# Patient Record
Sex: Female | Born: 1965 | Race: White | Hispanic: No | Marital: Married | Smoking: Never smoker
Health system: Southern US, Community
[De-identification: ages and names within clinical notes are randomized; demographics above are authoritative.]

## PROBLEM LIST (undated history)

## (undated) DIAGNOSIS — N83201 Unspecified ovarian cyst, right side: Secondary | ICD-10-CM

## (undated) DIAGNOSIS — G43709 Chronic migraine without aura, not intractable, without status migrainosus: Secondary | ICD-10-CM

## (undated) DIAGNOSIS — K5790 Diverticulosis of intestine, part unspecified, without perforation or abscess without bleeding: Secondary | ICD-10-CM

## (undated) DIAGNOSIS — K36 Other appendicitis: Secondary | ICD-10-CM

## (undated) DIAGNOSIS — K8020 Calculus of gallbladder without cholecystitis without obstruction: Secondary | ICD-10-CM

## (undated) DIAGNOSIS — K589 Irritable bowel syndrome without diarrhea: Secondary | ICD-10-CM

## (undated) DIAGNOSIS — I509 Heart failure, unspecified: Secondary | ICD-10-CM

## (undated) DIAGNOSIS — N83202 Unspecified ovarian cyst, left side: Secondary | ICD-10-CM

## (undated) DIAGNOSIS — N200 Calculus of kidney: Secondary | ICD-10-CM

## (undated) DIAGNOSIS — I10 Essential (primary) hypertension: Secondary | ICD-10-CM

## (undated) DIAGNOSIS — L409 Psoriasis, unspecified: Secondary | ICD-10-CM

## (undated) DIAGNOSIS — E119 Type 2 diabetes mellitus without complications: Secondary | ICD-10-CM

## (undated) DIAGNOSIS — D6851 Activated protein C resistance: Secondary | ICD-10-CM

## (undated) DIAGNOSIS — I639 Cerebral infarction, unspecified: Secondary | ICD-10-CM

## (undated) DIAGNOSIS — E782 Mixed hyperlipidemia: Secondary | ICD-10-CM

## (undated) DIAGNOSIS — D649 Anemia, unspecified: Secondary | ICD-10-CM

## (undated) DIAGNOSIS — G4733 Obstructive sleep apnea (adult) (pediatric): Secondary | ICD-10-CM

## (undated) DIAGNOSIS — K635 Polyp of colon: Secondary | ICD-10-CM

## (undated) DIAGNOSIS — I201 Angina pectoris with documented spasm: Secondary | ICD-10-CM

## (undated) DIAGNOSIS — C4490 Unspecified malignant neoplasm of skin, unspecified: Secondary | ICD-10-CM

## (undated) DIAGNOSIS — J45909 Unspecified asthma, uncomplicated: Secondary | ICD-10-CM

## (undated) HISTORY — DX: Essential (primary) hypertension: I10

## (undated) HISTORY — DX: Calculus of kidney: N20.0

## (undated) HISTORY — DX: Type 2 diabetes mellitus without complications: E11.9

## (undated) HISTORY — DX: Unspecified ovarian cyst, right side: N83.201

## (undated) HISTORY — DX: Chronic migraine without aura, not intractable, without status migrainosus: G43.709

## (undated) HISTORY — DX: Obstructive sleep apnea (adult) (pediatric): G47.33

## (undated) HISTORY — DX: Activated protein C resistance: D68.51

## (undated) HISTORY — DX: Psoriasis, unspecified: L40.9

## (undated) HISTORY — DX: Angina pectoris with documented spasm: I20.1

## (undated) HISTORY — DX: Unspecified ovarian cyst, right side: N83.202

## (undated) HISTORY — DX: Diverticulosis of intestine, part unspecified, without perforation or abscess without bleeding: K57.90

## (undated) HISTORY — DX: Irritable bowel syndrome, unspecified: K58.9

## (undated) HISTORY — DX: Unspecified malignant neoplasm of skin, unspecified: C44.90

## (undated) HISTORY — DX: Other appendicitis: K36

## (undated) HISTORY — DX: Unspecified asthma, uncomplicated: J45.909

## (undated) HISTORY — DX: Cerebral infarction, unspecified: I63.9

## (undated) HISTORY — PX: OOPHORECTOMY: SHX6387

## (undated) HISTORY — PX: CHOLECYSTECTOMY: SHX55

## (undated) HISTORY — PX: TONSILLECTOMY: SUR1361

## (undated) HISTORY — DX: Heart failure, unspecified: I50.9

## (undated) HISTORY — DX: Mixed hyperlipidemia: E78.2

## (undated) HISTORY — DX: Anemia, unspecified: D64.9

## (undated) HISTORY — DX: Calculus of gallbladder without cholecystitis without obstruction: K80.20

## (undated) HISTORY — DX: Polyp of colon: K63.5

---

## 1989-01-30 HISTORY — PX: TUBAL LIGATION: SHX77

## 2016-09-27 DIAGNOSIS — D6851 Activated protein C resistance: Secondary | ICD-10-CM | POA: Insufficient documentation

## 2016-09-27 DIAGNOSIS — K36 Other appendicitis: Secondary | ICD-10-CM | POA: Insufficient documentation

## 2016-09-27 DIAGNOSIS — I201 Angina pectoris with documented spasm: Secondary | ICD-10-CM | POA: Insufficient documentation

## 2016-09-27 DIAGNOSIS — E782 Mixed hyperlipidemia: Secondary | ICD-10-CM | POA: Insufficient documentation

## 2016-09-27 DIAGNOSIS — I10 Essential (primary) hypertension: Secondary | ICD-10-CM | POA: Insufficient documentation

## 2017-03-09 DIAGNOSIS — G43709 Chronic migraine without aura, not intractable, without status migrainosus: Secondary | ICD-10-CM | POA: Insufficient documentation

## 2017-12-20 DIAGNOSIS — Z8742 Personal history of other diseases of the female genital tract: Secondary | ICD-10-CM | POA: Insufficient documentation

## 2017-12-20 DIAGNOSIS — N816 Rectocele: Secondary | ICD-10-CM | POA: Insufficient documentation

## 2017-12-20 DIAGNOSIS — R198 Other specified symptoms and signs involving the digestive system and abdomen: Secondary | ICD-10-CM | POA: Insufficient documentation

## 2017-12-20 DIAGNOSIS — J4520 Mild intermittent asthma, uncomplicated: Secondary | ICD-10-CM | POA: Insufficient documentation

## 2017-12-20 DIAGNOSIS — R1031 Right lower quadrant pain: Secondary | ICD-10-CM | POA: Insufficient documentation

## 2017-12-20 DIAGNOSIS — R140 Abdominal distension (gaseous): Secondary | ICD-10-CM | POA: Insufficient documentation

## 2017-12-20 DIAGNOSIS — R87610 Atypical squamous cells of undetermined significance on cytologic smear of cervix (ASC-US): Secondary | ICD-10-CM | POA: Insufficient documentation

## 2017-12-20 DIAGNOSIS — G8929 Other chronic pain: Secondary | ICD-10-CM | POA: Insufficient documentation

## 2018-09-25 DIAGNOSIS — G459 Transient cerebral ischemic attack, unspecified: Secondary | ICD-10-CM | POA: Insufficient documentation

## 2018-09-25 DIAGNOSIS — I341 Nonrheumatic mitral (valve) prolapse: Secondary | ICD-10-CM | POA: Insufficient documentation

## 2018-09-25 DIAGNOSIS — K7581 Nonalcoholic steatohepatitis (NASH): Secondary | ICD-10-CM | POA: Insufficient documentation

## 2018-09-25 DIAGNOSIS — R7303 Prediabetes: Secondary | ICD-10-CM | POA: Insufficient documentation

## 2018-10-18 DIAGNOSIS — E119 Type 2 diabetes mellitus without complications: Secondary | ICD-10-CM | POA: Insufficient documentation

## 2019-07-29 DIAGNOSIS — G4733 Obstructive sleep apnea (adult) (pediatric): Secondary | ICD-10-CM | POA: Insufficient documentation

## 2019-12-31 DIAGNOSIS — K760 Fatty (change of) liver, not elsewhere classified: Secondary | ICD-10-CM

## 2019-12-31 HISTORY — DX: Fatty (change of) liver, not elsewhere classified: K76.0

## 2020-01-01 DIAGNOSIS — K760 Fatty (change of) liver, not elsewhere classified: Secondary | ICD-10-CM | POA: Insufficient documentation

## 2020-01-15 DIAGNOSIS — Z8673 Personal history of transient ischemic attack (TIA), and cerebral infarction without residual deficits: Secondary | ICD-10-CM | POA: Insufficient documentation

## 2020-02-05 DIAGNOSIS — R109 Unspecified abdominal pain: Secondary | ICD-10-CM | POA: Insufficient documentation

## 2020-02-05 DIAGNOSIS — L409 Psoriasis, unspecified: Secondary | ICD-10-CM | POA: Insufficient documentation

## 2020-02-05 DIAGNOSIS — G8929 Other chronic pain: Secondary | ICD-10-CM | POA: Insufficient documentation

## 2020-02-05 DIAGNOSIS — Z91014 Allergy to mammalian meats: Secondary | ICD-10-CM | POA: Insufficient documentation

## 2020-02-11 ENCOUNTER — Other Ambulatory Visit: Payer: Self-pay

## 2020-02-11 NOTE — Progress Notes (Signed)
New patient paperwork

## 2020-02-12 ENCOUNTER — Encounter: Payer: Self-pay | Admitting: Gastroenterology

## 2020-02-12 ENCOUNTER — Ambulatory Visit: Payer: Federal, State, Local not specified - PPO | Admitting: Gastroenterology

## 2020-02-12 VITALS — BP 130/80 | HR 84 | Ht 63.78 in | Wt 166.0 lb

## 2020-02-12 DIAGNOSIS — R1031 Right lower quadrant pain: Secondary | ICD-10-CM

## 2020-02-12 DIAGNOSIS — K5900 Constipation, unspecified: Secondary | ICD-10-CM

## 2020-02-12 DIAGNOSIS — R140 Abdominal distension (gaseous): Secondary | ICD-10-CM | POA: Diagnosis not present

## 2020-02-12 MED ORDER — CITRUCEL PO POWD: ORAL | Status: DC

## 2020-02-12 MED ORDER — DICYCLOMINE HCL 10 MG PO CAPS: 10.0000 mg | ORAL_CAPSULE | Freq: Three times a day (TID) | ORAL | 1 refills | Status: DC | PRN

## 2020-02-12 MED ORDER — IBGARD 90 MG PO CPCR: ORAL_CAPSULE | ORAL | Status: DC

## 2020-02-12 NOTE — Progress Notes (Signed)
HPI :  55 year old female with a history of chronic abdominal pain, fatty liver, factor V Leiden, self referred here for another opinion on chronic abdominal pain.  The patient is accompanied by her husband today. She reports having a focal spot on her right lower abdomen/inguinal area that is been painful since 2008 or so. This initially started as a more of a diffuse lower abdominal discomfort, at that time she had an evaluation which showed her left ovary was abnormal and she underwent left oophorectomy. This unfortunately did not relieve her symptoms and she has had more persistent pain in the right side over that time. She states the pain is present constantly, she is never pain-free. On average the pain is rated 2-3 out of 10 although she does have fluctuations in the severity and she has had severe pain from this which has brought her to the emergency room in the past. She had a prior colonoscopy years ago she thinks in 2015 which showed some scarring or inflammatory changes at the appendix. Raise the question of chronic appendicitis. She has had CT scans in the past where visualization of the appendix has been difficult. She most recently had a CT scan on December 1 which per the report shows a normal appendix. She has seen surgeons in the past to have reviewed her scans with her and have discussed appendectomy but they have not guaranteed it would take away her symptoms and she has not had this done. She actually had a colonoscopy within the past few weeks by general surgeon in [place] who wrote a limited report but appendiceal orifice was identified and no concerning findings were reported. No polyps.  She states that if she feels constipated this generally makes her feel worse. Following a bowel prep she states she has had some relief of the discomfort. Bowel movement somewhat improved the discomfort which she describes as a pressure in that area, but does not stop it. She has roughly 1 bowel  movement per day, takes fiber pills and does have some bloating at baseline. She has taken MiraLAX in the past which did not work as well for her. She feels as though if she leans forward that can also precipitate symptoms and there is some positional component to this as well. She denies any trauma to the area originally. She denies any weight loss. No blood in her stools but she does have hemorrhoids that bleed occasionally. She states she does have difficulty completely evacuating her rectum. She was seen at [facility] in 2019 and underwent anorectal manometry which was reportedly normal.  She otherwise eats okay, has no nausea or vomiting, no reflux symptoms, no upper abdominal complaints. No family history of colon cancer. Her niece did have ulcerative colitis. She states she tries not to take much of anything for this pain and ended up just dealing with it. When pain gets bad she takes Naprosyn as needed. She has been given peppermint supplements in the past to treat for bowel spasm and states this does help her from time to time. She is very frustrated by persistent symptoms over time and is looking for relief. Husband reports during tubal ligation there was some silicone implant as part of that and she states she is allergic to silicone and they wonder if that is what is causing her pain.  Prior workup Colonoscopy - 01/27/20 - Dr. Sherryl Manges - left sided diverticulosis, hemorrhoids - good prep reported  [place] 01/05/20 - normal uterus, R ovary  with 1.2cm cyst, otherwise normal  CT abdomen / pelvis 12/31/19 - fatty liver, normal appendix, normal pancreas, post cholecystectomy, normal CBD,   Xray abdomen 12/29/19 - normal  CT abdomen / pelvis with contrast 12/15/2016 - suspected 3.1 cm right ovary, appendix not visualized, bowel is normal, fatty liver, post chole  [place] vaginal 12/20/2017: unremarkable with normal appearing uterus (endometrium 3 mm thick), absent left ovary, and right ovary with only  a simple follicle (normal finding as she is still menstruating regularly). No other pelvic mass is noted.  Anorectal manometry 2019 - [facility] - normal  Fecal calprotectin negative  Colonoscopy 10/22/2013: Cecal polyp, polypectomy (TA) bengin; Ascending colon polyp, polypectomy (TA) benign; Cecum, polypectomy, prominent lymphoid aggregates. Moderate internal hemorrhoids noted.   Labs 12/31/19: - AST 24, ALT 27, AP 50, T bil 0.37 - BUN 20, Cr 0.93 - WBC 9.6, Hgb 14.6, MCV 94.6, plt 340  Labs 02/05/20: - AP 63, AST 38, ALT 39, normal renal function - WBC 7.31, Hgb 15.0, MCV 100, plt 381   Past Medical History:  Diagnosis Date  . Anemia   . Asthma   . Bilateral ovarian cysts   . CHF (congestive heart failure) ([place])   . Chronic appendicitis   . Chronic migraine without aura   . Colon polyps   . Essential hypertension   . Factor V Leiden mutation ([place])   . Gallstones   . Hepatic steatosis 12/2019   CT  . IBS (irritable bowel syndrome)   . Kidney stones   . Mixed hyperlipidemia   . Obstructive sleep apnea of adult    CPAP  . 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 Psoriasis   . Skin cancer   . Stroke ([place])   . Type II diabetes mellitus ([place])      Past Surgical History:  Procedure Laterality Date  . CHOLECYSTECTOMY    . OOPHORECTOMY Left   . TONSILLECTOMY    . TUBAL LIGATION  1991   Family History  Problem Relation Age of Onset  . Hypertension Mother   . Alzheimer's disease Mother   . Colon polyps Mother   . Factor V Leiden deficiency Mother   . Deep vein thrombosis Mother   . Heart disease Mother   . Hypertension Father   . Heart disease Father   . Multiple sclerosis Sister   . Alzheimer's disease Maternal Grandmother   . Colon polyps Brother   . Diabetes Brother   . Liver disease Maternal Grandfather   . Liver disease Paternal Grandfather   . Liver disease Paternal Uncle   . Colon polyps Sister   . Factor V Leiden deficiency Sister   . Celiac disease Niece   .  Ulcerative colitis Niece   . Irritable bowel syndrome Niece    Social History   Tobacco Use  . Smoking status: Never Smoker  . Smokeless tobacco: Never Used  Vaping Use  . Vaping Use: Never used  Substance Use Topics  . Alcohol use: Yes    Comment: rarely-once a year  . Drug use: Never   Current Outpatient Medications  Medication Sig Dispense Refill  . acetaminophen (TYLENOL) 500 MG tablet Take 500 mg by mouth as needed.    Marland Kitchen aspirin EC 81 MG tablet Take 81 mg by mouth daily. Swallow whole.    . Cholecalciferol (VITAMIN D3) 50 MCG (2000 UT) CAPS Take by mouth daily.    . Coenzyme Q10 (COQ-10) 100 MG CAPS Take 1 capsule by mouth daily.    Marland Kitchen  hydrochlorothiazide (HYDRODIURIL) 25 MG tablet Take 25 mg by mouth daily.    . Ibuprofen 200 MG CAPS Take by mouth as needed.    . Magnesium Gluconate (MAGNESIUM 27 PO) Take by mouth.    . Multiple Vitamin (MULTIVITAMIN) tablet Take 1 tablet by mouth daily.    . Omega-3 Fatty Acids (FISH OIL) 1000 MG CAPS Take 2 capsules by mouth 2 (two) times daily.    . TURMERIC CURCUMIN PO Take 1 tablet by mouth daily.    Marland Kitchen EPINEPHrine 0.3 mg/0.3 mL IJ SOAJ injection as needed. (Patient not taking: Reported on 02/12/2020)     No current facility-administered medications for this visit.   Allergies  Allergen Reactions  . Penicillins Anaphylaxis and Other (See Comments)  . Beef Allergy   . Codeine Other (See Comments)    Spasms   . Gluten Meal Other (See Comments)  . Lactose Intolerance (Gi)   . Meperidine Hcl   . Sulfa Antibiotics Other (See Comments)    Cramping and bloating      Review of Systems: All systems reviewed and negative except where noted in HPI.   Labs and imaging per HPI   Physical Exam: BP 130/80 (BP Location: Left Arm, Patient Position: Sitting, Cuff Size: Normal)   Pulse 84   Ht 5' 3.78" (1.62 m) Comment: height measured without shoes  Wt 166 lb (75.3 kg)   LMP 02/01/2020   BMI 28.69 kg/m  Constitutional:  Pleasant,well-developed,female in no acute distress. HEENT: Normocephalic and atraumatic. Conjunctivae are normal. No scleral icterus. Neck supple.  Cardiovascular: Normal rate, regular rhythm.  Pulmonary/chest: Effort normal and breath sounds normal. No wheezing, rales or rhonchi. Abdominal: Soft, nondistended, nontender. Focal area of tenderness just superior to mid inguinal line in RLQ, negative carnett. There are no masses palpable.  Extremities: no edema Lymphadenopathy: No cervical adenopathy noted. Neurological: Alert and oriented to person place and time. Skin: Skin is warm and dry. No rashes noted. Psychiatric: Normal mood and affect. Behavior is normal.   ASSESSMENT AND PLAN: 55 y/o female here for new patient consultation regarding the following:  Abdominal pain Bloating Constipation  See detailed history as outlined above. She has had an extensive evaluation with multiple imaging studies and colonoscopy without clear etiology for her pain. There was a question of some inflammatory changes at the AO on prior colonoscopy although that was not seen on recent colonoscopy and recent CT scan does not show any clear abnormalities there. She has seen a general surgery to consider appendectomy in the past but I would think this is a last resort as I'm not convinced this would relieve her symptoms. I discussed causes of chronic abdominal pain with her. She does have some slight improvement with a bowel movement but does not relieve it significantly. There is also some positional component to this as well. It is very possible this could be musculoskeletal or nerve impingement in etiology and we discussed what that is. Bowel spasm could also be playing a role in this although perhaps less likely. I reassured them that we do not see anything concerning on her imaging or colonoscopy, she does not have cancer or Crohn's disease etc. She does have some benefit at times with peppermint, she can  continue that as needed in the form of IBgard, I will also give her a trial of antispasmodic Bentyl 10 mg, 1-2 tabs every 8 hours as needed to see if that provides any benefit. In light of the bloating she has we  will stop her gummy fibers and switch to Citrucel to see if that is better tolerated. We will see how she does with this over the next few weeks. If symptoms persist and continue to bother her I will refer her to sports medicine clinic, Dr. Gardenia Phlegm for evaluation of possible nerve impingement or musculoskeletal etiology. If that is a negative evaluation or they cannot get her feeling better than ex lap with appendectomy I would think it is her last resort/option, as it would be uncertain if that would provide any relief to her pain. She agreed with the plan, all questions answered, they will touch base with me in a few weeks and give me an update.  I spent 65 minutes of time, including in depth chart review, independent review of results as outlined above,  face-to-face time with the patient, and documenting this encounter   Richfield Cellar, MD [facility]

## 2020-02-12 NOTE — Patient Instructions (Addendum)
If you are age 56 or older, your body mass index should be between 23-30. Your Body mass index is 28.69 kg/m. If this is out of the aforementioned range listed, please consider follow up with your Primary Care Provider.  If you are age 30 or younger, your body mass index should be between 19-25. Your Body mass index is 28.69 kg/m. If this is out of the aformentioned range listed, please consider follow up with your Primary Care Provider.   Discontinue fiber gummies.  Please purchase the following medications over the counter and take as directed: Citrucel: Take once daily  We have sent the following medications to your pharmacy for you to pick up at your convenience: Bentyl 10 mg: Take 1 to 2 tablets every 8 hours as needed  Continue IBgard.  Please contact us in 2 weeks if you are not improved.    Thank you for entrusting me with your care and for choosing [facility], Dr. [place] Cellar

## 2020-02-20 DIAGNOSIS — K5900 Constipation, unspecified: Secondary | ICD-10-CM | POA: Insufficient documentation

## 2020-02-20 DIAGNOSIS — N852 Hypertrophy of uterus: Secondary | ICD-10-CM | POA: Insufficient documentation

## 2020-02-20 DIAGNOSIS — Z9851 Tubal ligation status: Secondary | ICD-10-CM | POA: Insufficient documentation

## 2020-02-26 ENCOUNTER — Telehealth: Payer: Self-pay | Admitting: Gastroenterology

## 2020-02-26 DIAGNOSIS — R1031 Right lower quadrant pain: Secondary | ICD-10-CM

## 2020-02-26 NOTE — Telephone Encounter (Signed)
Ambulatory referral to Dr. Hulan Saas at Sports medicine in [place].  Spoke with patient, she is aware that referral has been placed. Advised patient that it may take about a week for them to get her scheduled, advised that she could try calling to expedite the process. I provided patient with the phone number to Dr. Genevie Ann office. Patient verbalized understanding of all information and had no concerns at the end of the call.

## 2020-02-26 NOTE — Telephone Encounter (Signed)
Okay, thanks for the follow up. Can you please refer her to Dr. Gardenia Phlegm of sports medicine for evaluation? Thanks

## 2020-02-26 NOTE — Telephone Encounter (Addendum)
Spoke with patient, she wanted to let us know that she has not seen any improvement in her abdominal pain with Bentyl that was prescribed, she states that she does take the [place] as needed, she states that her symptoms are "unchanged", reports RLQ pain that she can't describe but she knows something is there. Patient would like to proceed with sports medicine evaluation at this time. Please advise, thank you.

## 2020-03-02 ENCOUNTER — Other Ambulatory Visit: Payer: Self-pay

## 2020-03-02 ENCOUNTER — Encounter: Payer: Self-pay | Admitting: Family Medicine

## 2020-03-02 ENCOUNTER — Ambulatory Visit: Payer: Self-pay

## 2020-03-02 ENCOUNTER — Telehealth: Payer: Self-pay | Admitting: Gastroenterology

## 2020-03-02 ENCOUNTER — Ambulatory Visit: Payer: Federal, State, Local not specified - PPO | Admitting: Family Medicine

## 2020-03-02 VITALS — BP 142/82 | HR 87 | Ht 63.0 in | Wt 166.0 lb

## 2020-03-02 DIAGNOSIS — R1031 Right lower quadrant pain: Secondary | ICD-10-CM

## 2020-03-02 DIAGNOSIS — G8929 Other chronic pain: Secondary | ICD-10-CM | POA: Diagnosis not present

## 2020-03-02 NOTE — Patient Instructions (Signed)
Nice to meet you I do not see a true hernia Do have some constipation Some pelvic floor dysfunction that could be contributing Speak with Gyn for next steps My next step would be PT for pelvic floor Write to me in MyChart with any questions

## 2020-03-02 NOTE — Telephone Encounter (Signed)
Pt is requesting a call back from a nurse (pt did not disclose further info)

## 2020-03-02 NOTE — Telephone Encounter (Signed)
Lm on vm for patient to return call 

## 2020-03-02 NOTE — Telephone Encounter (Signed)
Spoke with patient, she states that she had her appt with sports medicine today, she states that she does not have a hernia,she was told that she had narrowing of the bowel and then a big dilation beyond it, she stated that the [place] also showed stool calcifications, Dr. Tamala Julian recommended PT for pelvic floor dysfunction but they will discuss that further after patient follows up with GYN. Patient states that she currently takes 1 & 1/2 doses of Miralax daily along with 2 gm of Citrucel in the am and evening, pt states that she is having bowel movements - last one was earlier today, she reports that the LLQ pain has subsided a little bit. Patient wants to completely clean her colon and get rid of the calcifications, advised that she can try a Miralax purge, she states that she was previously on Linzess and wanted to know if you would consider a trial of this for her, she does not remember the dosage that she was on previously but states that she is willing to try it again. Please advise, thank you.

## 2020-03-02 NOTE — Telephone Encounter (Signed)
Thanks [employer]. I just read Dr. Thompson Caul note - he does not see an obvious hernia, could be multifactorial pain. She recently had a colonoscopy a month ago, so not sure what to make of stool burden there on [place]. I don't think constipation is causing all of her pain but if she wants to try a more aggressive bowel regimen we can give her Linzess 151mg / day for 1 month to try and see how she does with that, she can take 1-2 tabs per day. If she wants to do a Miralax purge as well that is fine. Thanks

## 2020-03-02 NOTE — Progress Notes (Signed)
[facility] [place] Phone: [phone] Subjective:   Fontaine No, am serving as a scribe for Dr. Hulan Saas. This visit occurred during the SARS-CoV-2 public health emergency.  Safety protocols were in place, including screening questions prior to the visit, additional usage of staff PPE, and extensive cleaning of exam room while observing appropriate contact time as indicated for disinfecting solutions.   I'm seeing this patient by the request  of:  Emelda Fear, DO  CC: Right lower quadrant pain  JOA:[id number]  Pasqualina Colasurdo is a 55 y.o. female coming in with complaint of RLQ pain for years.  Thinks it is starting since 2008.  Pain is constant and dull. Has had episodes that become very sharp where she will have to go into ED. No pattern to her pain. Does not pain when she was putting dishes overhead and noticed pain in lower quadrant. Also was on knees fixing dishwasher and she tensed up as her dog came near her and she felt sudden sharp pain.   History of positive ANA.  Had tubes tied and feels that ring may be causing allergy.   Patient has had multiple different work-ups for this over the course of the years.  Patient has had diagnoses such as chronic appendicitis, rectocele, ovarian cyst, as well as enlarged uterus patient is concerned that some of this could be coming from an allergy to the silicone from her tubal ligation..    Prior workup Colonoscopy - 01/27/20 - Dr. Sherryl Manges - left sided diverticulosis, hemorrhoids - good prep reported  [place] 01/05/20 - normal uterus, R ovary with 1.2cm cyst, otherwise normal  CT abdomen / pelvis 12/31/19 - fatty liver, normal appendix, normal pancreas, post cholecystectomy, normal CBD,   Xray abdomen 12/29/19 - normal  Past Medical History:  Diagnosis Date  . Anemia   . Asthma   . Bilateral ovarian cysts   . CHF (congestive heart failure) ([place])   . Chronic  appendicitis   . Chronic migraine without aura   . Colon polyps   . Diverticulosis   . Essential hypertension   . Factor V Leiden mutation ([place])   . Gallstones   . Hepatic steatosis 12/2019   CT  . IBS (irritable bowel syndrome)   . Kidney stones   . Mixed hyperlipidemia   . Obstructive sleep apnea of adult    CPAP  . 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 Psoriasis   . Skin cancer   . Stroke ([place])   . Type II diabetes mellitus ([place])    Past Surgical History:  Procedure Laterality Date  . CHOLECYSTECTOMY    . OOPHORECTOMY Left   . TONSILLECTOMY    . TUBAL LIGATION  1991   Social History   Socioeconomic History  . Marital status: Married    Spouse name: Not on file  . Number of children: 2  . Years of education: Not on file  . Highest education level: Not on file  Occupational History  . Occupation: Home maker  Tobacco Use  . Smoking status: Never Smoker  . Smokeless tobacco: Never Used  Vaping Use  . Vaping Use: Never used  Substance and Sexual Activity  . Alcohol use: Yes    Comment: rarely-once a year  . Drug use: Never  . Sexual activity: Not on file  Other Topics Concern  . Not on file  Social History Narrative  . Not on file   Social Determinants  of Health   Financial Resource Strain: Not on file  Food Insecurity: Not on file  Transportation Needs: Not on file  Physical Activity: Not on file  Stress: Not on file  Social Connections: Not on file   Allergies  Allergen Reactions  . Penicillins Anaphylaxis and Other (See Comments)  . Beef Allergy   . Codeine Other (See Comments)    Spasms   . Gluten Meal Other (See Comments)  . Lactose Intolerance (Gi)   . Meperidine Hcl   . Sulfa Antibiotics Other (See Comments)    Cramping and bloating    Family History  Problem Relation Age of Onset  . Hypertension Mother   . Alzheimer's disease Mother   . Colon polyps Mother   . Factor V Leiden deficiency Mother   . Deep vein thrombosis Mother   . Heart  disease Mother   . Hypertension Father   . Heart disease Father   . Multiple sclerosis Sister   . Alzheimer's disease Maternal Grandmother   . Colon polyps Brother   . Diabetes Brother   . Liver disease Maternal Grandfather   . Liver disease Paternal Grandfather   . Liver disease Paternal Uncle   . Colon polyps Sister   . Factor V Leiden deficiency Sister   . Celiac disease Niece   . Ulcerative colitis Niece   . Irritable bowel syndrome Niece      Current Outpatient Medications (Cardiovascular):  Marland Kitchen  EPINEPHrine 0.3 mg/0.3 mL IJ SOAJ injection, as needed. .  hydrochlorothiazide (HYDRODIURIL) 25 MG tablet, Take 25 mg by mouth daily.   Current Outpatient Medications (Analgesics):  .  acetaminophen (TYLENOL) 500 MG tablet, Take 500 mg by mouth as needed. Marland Kitchen  aspirin EC 81 MG tablet, Take 81 mg by mouth daily. Swallow whole. .  Ibuprofen 200 MG CAPS, Take by mouth as needed.   Current Outpatient Medications (Other):  Marland Kitchen  Cholecalciferol (VITAMIN D3) 50 MCG (2000 UT) CAPS, Take by mouth daily. .  Coenzyme Q10 (COQ-10) 100 MG CAPS, Take 1 capsule by mouth daily. Marland Kitchen  dicyclomine (BENTYL) 10 MG capsule, Take 1-2 capsules (10-20 mg total) by mouth every 8 (eight) hours as needed for spasms. .  Magnesium Gluconate (MAGNESIUM 27 PO), Take by mouth. .  methylcellulose (CITRUCEL) oral powder, Take as directed, Daily .  Multiple Vitamin (MULTIVITAMIN) tablet, Take 1 tablet by mouth daily. .  Omega-3 Fatty Acids (FISH OIL) 1000 MG CAPS, Take 2 capsules by mouth 2 (two) times daily. Marland Kitchen  Peppermint Oil (IBGARD) 90 MG CPCR, Use as directed .  TURMERIC CURCUMIN PO, Take 1 tablet by mouth daily.   Reviewed prior external information including notes and imaging from  primary care provider As well as notes that were available from care everywhere and other healthcare systems.  Past medical history, social, surgical and family history all reviewed in electronic medical record.  No pertanent  information unless stated regarding to the chief complaint.   Review of Systems:  No headache, visual changes, nausea, vomiting, diarrhea, constipation, dizziness, abdominal pain, skin rash, fevers, chills, night sweats, weight loss, swollen lymph nodes, body aches, joint swelling, chest pain, shortness of breath, mood changes. POSITIVE muscle aches  Objective  Blood pressure (!) 142/82, pulse 87, height 5' 3"  (1.6 m), weight 166 lb (75.3 kg), last menstrual period 02/01/2020, SpO2 98 %.   General: No apparent distress alert and oriented x3 mood and affect normal, dressed appropriately.  HEENT: Pupils equal, extraocular movements intact  Respiratory: Patient's  speak in full sentences and does not appear short of breath  Cardiovascular: No lower extremity edema, non tender, no erythema  Gait normal with good balance and coordination.  MSK:  Non tender with full range of motion and good stability and symmetric strength and tone of shoulders, elbows, wrist, hip, knee and ankles bilaterally.  Abdominal exam shows the patient does have fullness of the right lower quadrant.  Patient does not have any true masses.  Patient is tender to palpation approximately 3 fingerbreadths above the ASIS and 1 fingerbreadth medial to this.  Patient has no pain though with hip flexion, internal and external range of motion.  Inguinal canal does not have any bulging noted.  No pain with adduction of the hip.  Neurovascularly intact distally.  Negative straight leg test.  Limited musculoskeletal ultrasound was performed and interpreted by Lyndal Pulley  Limited ultrasound of patient's abdominal wall does not show any true defect of the fascia noted.  With Valsalva I do not see anything the patient does have a fairly large stool burden noted of the right lower quadrant.  No masses appreciated.  In the area where patient is the most tender but no true hernia noted. Impression: Unremarkable ultrasound of the abdominal  wall   Impression and Recommendations:     The above documentation has been reviewed and is accurate and complete Lyndal Pulley, DO

## 2020-03-02 NOTE — Assessment & Plan Note (Signed)
Patient has had this for many years.  Seen multiple providers including gynecology and gastroenterology.  Patient was sent here to see if there was any musculoskeletal current complaint that could be contributing.  On ultrasound today he then limited in the right lower quadrant I do not see any true hernia noted.  I do believe that patient is having some pelvic floor dysfunction that could be contributing to some of this but I do believe that patient's rectocele, possible ovarian cyst, and chronic constipation could also be contributing to some of her aches and pains that patient is having.  Patient is going to follow-up with her gynecologist to further decide what is the next treatment options including it sounds like they are going to do a transvaginal ultrasound.  Possible other imaging we discussed was the MRI pelvis but I do not think it would show [place] anything else and we did encourage the possibility of formal physical therapy for pelvic floor dysfunction that could be helpful.  Other than that we discussed continuing with her bowel regimen secondary to still a large stool burden in the right lower quadrant.  Patient will hold on follow-up with me until she sees the other providers and discuss afterwards.

## 2020-03-03 MED ORDER — LINACLOTIDE 145 MCG PO CAPS: 145.0000 ug | ORAL_CAPSULE | Freq: Every day | ORAL | 0 refills | Status: DC

## 2020-03-03 NOTE — Telephone Encounter (Signed)
Spoke with patient in regards to Dr. Doyne Keel recommendations. Patient would like to try Linzess 145 mcg, advised that we will try this for a month and see how she does, she is aware that she can try the Miralax purge as well. Advised patient that she may develop loose stools but should let us know. Patient verbalized understanding of all information and had no concerns at the end of the call.   Prescription sent to pharmacy on file - pt confirmed

## 2020-03-04 ENCOUNTER — Telehealth: Payer: Self-pay

## 2020-03-04 NOTE — Telephone Encounter (Signed)
PA for Linzess 145 mcg APPROVED. The authorization is valid from 02/03/2020 through 03/04/2021

## 2020-03-10 NOTE — Telephone Encounter (Signed)
Called and spoke to patient.  The Linzess 145 mcg have been working well.  She wanted to make sure it is OK to take the Citrucel Dr. Havery Moros had recommended. She also will need a refill in a few weeks.  She understands to just let the pharmacy know when she needs a refill.  She wanted Dr. Havery Moros to know how grateful she is. She indicates she has not felt this good in years.

## 2020-03-10 NOTE — Telephone Encounter (Signed)
Thanks Jan. I'm happy to hear this. Sounds like the Linzess has been helping and would continue that. Up to her if she wishes to continue Citrucel on top of that. If this combination works for her that's fine and safe, but she may get the same benefit with just the Linzess at this point.

## 2020-03-10 NOTE — Telephone Encounter (Signed)
Pt is requesting a call back to discuss taking her Linzess with fiber.

## 2020-03-18 NOTE — Progress Notes (Unsigned)
I, Wendy Poet, LAT, ATC, am serving as scribe for Dr. Lynne Leader.  Theresa Gonzales is a 55 y.o. female who presents to [place] [facility] today for L shoulder and elbow pain since last week after sitting in his husband's truck when she was leaning on her L elbow/arm . Pt was last seen in clinic by Dr. Tamala Julian for abdominal pain and RLQ pain on 03/02/20. Pt locates pain to her L lateral shoulder and into her L upper arm.  She is retired but currently renovating an old house.  Radiates: yes into the L upper arm Neck pain: yes into the L lateral neck Aggravates: sitting w/ L elbow flexed for an extended period of time; L shoulder AROM above 90 deg Treatments tried: ice, heat, naproxen   Pertinent review of systems: No fevers or chills  Relevant historical information: History of angina, diabetes, factor V Leiden   Exam:  BP 122/84 (BP Location: Right Arm, Patient Position: Sitting, Cuff Size: Normal)   Pulse 88   Ht 5' 3"  (1.6 m)   Wt 158 lb 9.6 oz (71.9 kg)   SpO2 98%   BMI 28.09 kg/m  General: Well Developed, well nourished, and in no acute distress.   MSK: Left shoulder normal. Tender palpation anterior shoulder. Range of motion abduction 100 degrees.  External rotation full internal rotation lumbar spine. Strength 4/5 abduction 5/5 external and internal rotation. Positive Hawkins and Neer's test. Positive empty can test. Positive Yergason's and speeds test.  Left elbow normal-appearing Nontender. Normal passive range of motion. Normal active range of motion. Pain-free passive range of motion.  However active resisted flexion and active resisted supination produce anterior shoulder pain.    Lab and Radiology Results Procedure: Real-time Ultrasound Guided Injection of left shoulder subacromial bursa Device: Philips Affiniti 50G Images permanently stored and available for review in PACS Ultrasound evaluation prior to injection reveals intact biceps  tendon. Subscapularis tendon is intact. Supraspinatus tendon is thin but intact. Moderate subacromial bursitis is present. Infraspinatus tendon is intact. AC joint degenerative with effusion. Verbal informed consent obtained.  Discussed risks and benefits of procedure. Warned about infection bleeding damage to structures skin hypopigmentation and fat atrophy among others. Patient expresses understanding and agreement Time-out conducted.   Noted no overlying erythema, induration, or other signs of local infection.   Skin prepped in a sterile fashion.   Local anesthesia: Topical Ethyl chloride.   With sterile technique and under real time ultrasound guidance:  40 mg of Kenalog and 2 mL of Marcaine injected into subacromial bursa. Fluid seen entering the bursa.   Completed without difficulty   Pain moderately resolved suggesting accurate placement of the medication.   Advised to call if fevers/chills, erythema, induration, drainage, or persistent bleeding.   Images permanently stored and available for review in the ultrasound unit.  Impression: Technically successful ultrasound guided injection.         Assessment and Plan: 55 y.o. female with left shoulder pain thought to be due to subacromial bursitis.  Patient may have a component of biceps tendinitis although this was not well seen on ultrasound.  Plan for steroid injection as above, and referral to physical therapy.  Recheck back in about 6 weeks.  Recheck or contact me sooner if not improving.   PDMP not reviewed this encounter. Orders Placed This Encounter  Procedures  . [place] LIMITED JOINT SPACE STRUCTURES UP LEFT(NO LINKED CHARGES)    Order Specific Question:   Reason for Exam (  SYMPTOM  OR DIAGNOSIS REQUIRED)    Answer:   L shoulder pain    Order Specific Question:   Preferred imaging location?    Answer:   [place]  . Ambulatory referral to Physical Therapy    Referral Priority:   Routine     Referral Type:   Physical Medicine    Referral Reason:   Specialty Services Required    Requested Specialty:   Physical Therapy   No orders of the defined types were placed in this encounter.    Discussed warning signs or symptoms. Please see discharge instructions. Patient expresses understanding.   The above documentation has been reviewed and is accurate and complete Lynne Leader, M.D.

## 2020-03-19 ENCOUNTER — Ambulatory Visit: Payer: Self-pay

## 2020-03-19 ENCOUNTER — Other Ambulatory Visit: Payer: Self-pay

## 2020-03-19 ENCOUNTER — Encounter: Payer: Self-pay | Admitting: Family Medicine

## 2020-03-19 ENCOUNTER — Ambulatory Visit (INDEPENDENT_AMBULATORY_CARE_PROVIDER_SITE_OTHER): Payer: Federal, State, Local not specified - PPO | Admitting: Family Medicine

## 2020-03-19 VITALS — BP 122/84 | HR 88 | Ht 63.0 in | Wt 158.6 lb

## 2020-03-19 DIAGNOSIS — M25512 Pain in left shoulder: Secondary | ICD-10-CM

## 2020-03-19 NOTE — Patient Instructions (Addendum)
Thank you for coming in today.  Call or go to the ER if you develop a large red swollen joint with extreme pain or oozing puss.   I've referred you to Physical Therapy.  Let us know if you don't hear from them in one week.  Recheck back in about 6 weeks.   Let me know if this is not working or if you have a problem.    Shoulder Impingement Syndrome Rehab Ask your health care provider which exercises are safe for you. Do exercises exactly as told by your health care provider and adjust them as directed. It is normal to feel mild stretching, pulling, tightness, or discomfort as you do these exercises. Stop right away if you feel sudden pain or your pain gets worse. Do not begin these exercises until told by your health care provider. Stretching and range-of-motion exercise This exercise warms up your muscles and joints and improves the movement and flexibility of your shoulder. This exercise also helps to relieve pain and stiffness. Passive horizontal adduction In passive adduction, you use your other hand to move the injured arm toward your body. The injured arm does not move on its own. In this movement, your arm is moved across your body in the horizontal plane (horizontal adduction). 1. Sit or stand and pull your left / right elbow across your chest, toward your other shoulder. Stop when you feel a gentle stretch in the back of your shoulder and upper arm. ? Keep your arm at shoulder height. ? Keep your arm as close to your body as you comfortably can. 2. Hold for __________ seconds. 3. Slowly return to the starting position. Repeat __________ times. Complete this exercise __________ times a day.   Strengthening exercises These exercises build strength and endurance in your shoulder. Endurance is the ability to use your muscles for a long time, even after they get tired. External rotation, isometric This is an exercise in which you press the back of your wrist against a door frame without  moving your shoulder joint (isometric). 1. Stand or sit in a doorway, facing the door frame. 2. Bend your left / right elbow and place the back of your wrist against the door frame. Only the back of your wrist should be touching the frame. Keep your upper arm at your side. 3. Gently press your wrist against the door frame, as if you are trying to push your arm away from your abdomen (external rotation). Press as hard as you are able without pain. ? Avoid shrugging your shoulder while you press your wrist against the door frame. Keep your shoulder blade tucked down toward the middle of your back. 4. Hold for __________ seconds. 5. Slowly release the tension, and relax your muscles completely before you repeat the exercise. Repeat __________ times. Complete this exercise __________ times a day. Internal rotation, isometric This is an exercise in which you press your palm against a door frame without moving your shoulder joint (isometric). 1. Stand or sit in a doorway, facing the door frame. 2. Bend your left / right elbow and place the palm of your hand against the door frame. Only your palm should be touching the frame. Keep your upper arm at your side. 3. Gently press your hand against the door frame, as if you are trying to push your arm toward your abdomen (internal rotation). Press as hard as you are able without pain. ? Avoid shrugging your shoulder while you press your hand against the door  frame. Keep your shoulder blade tucked down toward the middle of your back. 4. Hold for __________ seconds. 5. Slowly release the tension, and relax your muscles completely before you repeat the exercise. Repeat __________ times. Complete this exercise __________ times a day.   Scapular protraction, supine 1. Lie on your back on a firm surface (supine position). Hold a __________ weight in your left / right hand. 2. Raise your left / right arm straight into the air so your hand is directly above your  shoulder joint. 3. Push the weight into the air so your shoulder (scapula) lifts off the surface that you are lying on. The scapula will push up or forward (protraction). Do not move your head, neck, or back. 4. Hold for __________ seconds. 5. Slowly return to the starting position. Let your muscles relax completely before you repeat this exercise. Repeat __________ times. Complete this exercise __________ times a day.   Scapular retraction 1. Sit in a stable chair without armrests, or stand up. 2. Secure an exercise band to a stable object in front of you so the band is at shoulder height. 3. Hold one end of the exercise band in each hand. Your palms should face down. 4. Squeeze your shoulder blades together (retraction) and move your elbows slightly behind you. Do not shrug your shoulders upward while you do this. 5. Hold for __________ seconds. 6. Slowly return to the starting position. Repeat __________ times. Complete this exercise __________ times a day.   Shoulder extension 1. Sit in a stable chair without armrests, or stand up. 2. Secure an exercise band to a stable object in front of you so the band is above shoulder height. 3. Hold one end of the exercise band in each hand. 4. Straighten your elbows and lift your hands up to shoulder height. 5. Squeeze your shoulder blades together and pull your hands down to the sides of your thighs (extension). Stop when your hands are straight down by your sides. Do not let your hands go behind your body. 6. Hold for __________ seconds. 7. Slowly return to the starting position. Repeat __________ times. Complete this exercise __________ times a day.   This information is not intended to replace advice given to you by your health care provider. Make sure you discuss any questions you have with your health care provider. Document Revised: 05/10/2018 Document Reviewed: 02/11/2018 Elsevier Patient Education  2021 [employer].

## 2020-03-24 ENCOUNTER — Ambulatory Visit: Payer: Federal, State, Local not specified - PPO | Admitting: Physical Therapy

## 2020-03-30 ENCOUNTER — Telehealth: Payer: Self-pay | Admitting: Gastroenterology

## 2020-03-30 MED ORDER — LINACLOTIDE 145 MCG PO CAPS: 145.0000 ug | ORAL_CAPSULE | Freq: Every day | ORAL | 1 refills | Status: DC

## 2020-03-30 NOTE — Telephone Encounter (Signed)
Called and spoke to patient. She would like to have a refill and does need to take it twice a day sometimes. Will send as 1 to 2 tablets daily as indicated by Dr. Havery Moros.

## 2020-03-30 NOTE — Telephone Encounter (Signed)
Inbound call from patient requesting a call back please.  States it is time to renew her Linzess script but has questions about it.

## 2020-04-01 NOTE — Telephone Encounter (Signed)
Patient called back stating insurance is asking for prior authorization for Linzess please.

## 2020-04-02 MED ORDER — LINACLOTIDE 145 MCG PO CAPS: 145.0000 ug | ORAL_CAPSULE | Freq: Every day | ORAL | 1 refills | Status: DC

## 2020-04-02 NOTE — Addendum Note (Signed)
Addended by: Roetta Sessions on: 04/02/2020 11:04 AM   Modules accepted: Orders

## 2020-04-02 NOTE — Telephone Encounter (Signed)
Called and spoke to patient. Since insurance will not approve 1 to 2 tablets daily we will send a script for #90 with 1 refill and she can supplement with samples since she only occasionally needs 290 mcg a day. Changed script and resent.

## 2020-05-27 ENCOUNTER — Other Ambulatory Visit: Payer: Self-pay

## 2020-05-27 ENCOUNTER — Telehealth: Payer: Self-pay | Admitting: Family Medicine

## 2020-05-27 DIAGNOSIS — G8929 Other chronic pain: Secondary | ICD-10-CM

## 2020-05-27 NOTE — Telephone Encounter (Signed)
Per last visit with Dr. Tamala Julian, patient would like to proceed with a referral for pelvic floor PT. She has looked into [facility] but is happy to go anywhere we would recommend and could get her in quickly, as the pain is worse. She lives in [place] but is fine with [place].

## 2020-05-27 NOTE — Telephone Encounter (Signed)
Referral place and patient notified.

## 2020-06-16 ENCOUNTER — Encounter: Payer: Self-pay | Admitting: Gastroenterology

## 2020-06-16 ENCOUNTER — Other Ambulatory Visit: Payer: Self-pay

## 2020-06-16 ENCOUNTER — Ambulatory Visit: Payer: Federal, State, Local not specified - PPO | Admitting: Gastroenterology

## 2020-06-16 VITALS — BP 110/60 | HR 104 | Ht 63.0 in | Wt 156.0 lb

## 2020-06-16 DIAGNOSIS — R1031 Right lower quadrant pain: Secondary | ICD-10-CM

## 2020-06-16 DIAGNOSIS — K5909 Other constipation: Secondary | ICD-10-CM | POA: Diagnosis not present

## 2020-06-16 DIAGNOSIS — G8929 Other chronic pain: Secondary | ICD-10-CM

## 2020-06-16 MED ORDER — MOTEGRITY 2 MG PO TABS: 2.0000 mg | ORAL_TABLET | Freq: Every day | ORAL | 0 refills | Status: DC

## 2020-06-16 MED ORDER — LINACLOTIDE 290 MCG PO CAPS: 290.0000 ug | ORAL_CAPSULE | Freq: Every day | ORAL | 0 refills | Status: DC

## 2020-06-16 MED ORDER — NORTRIPTYLINE HCL 10 MG PO CAPS: 10.0000 mg | ORAL_CAPSULE | Freq: Every day | ORAL | 2 refills | Status: DC

## 2020-06-16 NOTE — Progress Notes (Signed)
HPI :  55 year old female with a history of chronic abdominal pain, fatty liver, factor V Leiden, chronic constipation, here for a follow up visit for abdominal pain.  Patient was last seen in January, please see full details of that note for history.  Chronic abdominal pain in the right lower quadrant since 2008.  She has had an extensive evaluation with imaging, colonoscopy, labs, stool testing, general surgery evaluation.  At her last visit we discussed using IBgard and Bentyl as needed, stopping gummy fibers and trying Citrucel, referral to sports medicine, she was evaluated by Dr. Gardenia Phlegm for possible musculoskeletal pain.  He did abdominal wall ultrasound and thought this could be multifactorial pain due to constipation, ovarian cyst, possible rectocele.  She has had worsening constipation.  Placed her on trial of Linzess at 145 mcg a day and she states she did really well for about a month on this.  When she was having regular bowel movements she states her pain was significantly improved her quality of life was much better.  She states for the past 6 weeks however her constipation has recurred despite taking Linzess daily, she feels like she has developed resistance to them.  She does feel that her pain correlates well with her constipation.  It is currently rated 3-4 out of 10 feels like a "tooth ache" that is persistent in her right lower quadrant.  She also does have some clear positional component to the pain as well such as bending over while working in the yard etc. can typically make it worse.  She does have sense of incomplete evacuation and getting stool out.  She has been referred to pelvic floor physical therapy but states this has not really helped at all.  She did have a normal anorectal manometry at [facility] in 2019.  She is currently trying a probiotic to see if that will help as well as any Citrucel, tea laxatives, enemas as needed.  She is seen UroGYN for possible rectocele as  well.  See below for extensive work-up to date.  Prior workup Colonoscopy - 01/27/20 - Dr. Sherryl Manges - left sided diverticulosis, hemorrhoids - good prep reported  [place] 01/05/20 - normal uterus, R ovary with 1.2cm cyst, otherwise normal  CT abdomen / pelvis 12/31/19 - fatty liver, normal appendix, normal pancreas, post cholecystectomy, normal CBD,   Xray abdomen 12/29/19 - normal  CT abdomen / pelvis with contrast 12/15/2016 - suspected 3.1 cm right ovary, appendix not visualized, bowel is normal, fatty liver, post chole  [place] vaginal 12/20/2017: unremarkable with normal appearing uterus (endometrium 3 mm thick), absent left ovary, and right ovary with only a simple follicle (normal finding as she is still menstruating regularly). No other pelvic mass is noted.  Anorectal manometry 2019 - [facility] - normal  Fecal calprotectin negative  Colonoscopy 10/22/2013: Cecal polyp, polypectomy (TA) bengin; Ascending colon polyp, polypectomy (TA) benign; Cecum, polypectomy, prominent lymphoid aggregates. Moderate internal hemorrhoids noted.   Labs 12/31/19: - AST 24, ALT 27, AP 50, T bil 0.37 - BUN 20, Cr 0.93 - WBC 9.6, Hgb 14.6, MCV 94.6, plt 340  Labs 02/05/20: - AP 63, AST 38, ALT 39, normal renal function - WBC 7.31, Hgb 15.0, MCV 100, plt 381    Past Medical History:  Diagnosis Date  . Anemia   . Asthma   . Bilateral ovarian cysts   . CHF (congestive heart failure) ([place])   . Chronic appendicitis   . Chronic migraine without aura   .  Colon polyps   . Diverticulosis   . Essential hypertension   . Factor V Leiden mutation ([place])   . Gallstones   . Hepatic steatosis 12/2019   CT  . IBS (irritable bowel syndrome)   . Kidney stones   . Mixed hyperlipidemia   . Obstructive sleep apnea of adult    CPAP  . 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 Psoriasis   . Skin cancer   . Stroke ([place])   . Type II diabetes mellitus ([place])      Past Surgical History:  Procedure Laterality Date  .  CHOLECYSTECTOMY    . OOPHORECTOMY Left   . TONSILLECTOMY    . TUBAL LIGATION  1991   Family History  Problem Relation Age of Onset  . Hypertension Mother   . Alzheimer's disease Mother   . Colon polyps Mother   . Factor V Leiden deficiency Mother   . Deep vein thrombosis Mother   . Heart disease Mother   . Hypertension Father   . Heart disease Father   . Multiple sclerosis Sister   . Alzheimer's disease Maternal Grandmother   . Colon polyps Brother   . Diabetes Brother   . Liver disease Maternal Grandfather   . Liver disease Paternal Grandfather   . Liver disease Paternal Uncle   . Colon polyps Sister   . Factor V Leiden deficiency Sister   . Celiac disease Niece   . Ulcerative colitis Niece   . Irritable bowel syndrome Niece   . Colon cancer Neg Hx   . Pancreatic cancer Neg Hx   . Esophageal cancer Neg Hx    Social History   Tobacco Use  . Smoking status: Never Smoker  . Smokeless tobacco: Never Used  Vaping Use  . Vaping Use: Never used  Substance Use Topics  . Alcohol use: Yes    Comment: rarely-once a year  . Drug use: Never   Current Outpatient Medications  Medication Sig Dispense Refill  . acetaminophen (TYLENOL) 500 MG tablet Take 500 mg by mouth as needed.    Marland Kitchen aspirin EC 81 MG tablet Take 81 mg by mouth daily. Swallow whole.    . Cholecalciferol (VITAMIN D3) 50 MCG (2000 UT) CAPS Take by mouth daily.    . Coenzyme Q10 (COQ-10) 100 MG CAPS Take 1 capsule by mouth daily.    Marland Kitchen EPINEPHrine 0.3 mg/0.3 mL IJ SOAJ injection as needed.    . hydrochlorothiazide (HYDRODIURIL) 25 MG tablet Take 25 mg by mouth daily.    . Ibuprofen 200 MG CAPS Take by mouth as needed.    . linaclotide (LINZESS) 145 MCG CAPS capsule Take 1 capsule (145 mcg total) by mouth daily before breakfast. 90 capsule 1  . Magnesium Gluconate (MAGNESIUM 27 PO) Take by mouth.    . metFORMIN (GLUCOPHAGE) 500 MG tablet Take by mouth.    . methylcellulose (CITRUCEL) oral powder Take as directed,  Daily    . Multiple Vitamin (MULTIVITAMIN) tablet Take 1 tablet by mouth daily.    . Omega-3 Fatty Acids (FISH OIL) 1000 MG CAPS Take 2 capsules by mouth 2 (two) times daily.    Marland Kitchen OTEZLA 30 MG TABS Take 1 tablet by mouth 2 (two) times daily.    Marland Kitchen Peppermint Oil (IBGARD) 90 MG CPCR Use as directed    . simvastatin (ZOCOR) 10 MG tablet Take by mouth.    . TURMERIC CURCUMIN PO Take 1 tablet by mouth daily.     No current facility-administered medications  for this visit.   Allergies  Allergen Reactions  . Penicillins Anaphylaxis and Other (See Comments)  . Beef Allergy   . Codeine Other (See Comments)    Spasms   . Gluten Meal Other (See Comments)  . Lactose Intolerance (Gi)   . Meperidine Hcl   . Sulfa Antibiotics Other (See Comments)    Cramping and bloating      Review of Systems: All systems reviewed and negative except where noted in HPI.   Labs per HPI  Physical Exam: BP 110/60   Pulse (!) 104   Ht 5' 3"  (1.6 m)   Wt 156 lb (70.8 kg)   SpO2 96%   BMI 27.63 kg/m  Constitutional: Pleasant,well-developed, female in no acute distress. Neurological: Alert and oriented to person place and time. Psychiatric: Normal mood and affect. Behavior is normal.   ASSESSMENT AND PLAN: 55 year old female here for reassessment of following:  Chronic right lower quadrant pain Constipation  As above, several years worth of persistent chronic pain in the right lower quadrant with negative extensive evaluation.  She clearly feels better when her constipation is treated so that appears to be a component of this.  She also has a significant positional component to this, unclear if she has nerve entrapment or other musculoskeletal component.  Linzess appeared to make her much better for several weeks but then she lost response.  She has a normal anorectal manometry and has been referred to pelvic floor PT which is not helping her symptoms, suspect more likely slow transit constipation.  We  discussed options.  I do not think probiotic is likely to be helping and may make her bloating worse and we will stop that, she also stated that the probiotics were extremely expensive.  We discussed other ways to treat slow transit constipation, I gave her samples of Linzess 290 mcg a day to try that for a few weeks and see if that helps.  If she is responsive to that I would continue it.  If not I also gave her a trial of Motegrity 2 mg a day to see if that would provide benefit.  She should not use these 2 together.  I also discussed other options to treat chronic pain syndrome/neuropathic pain.  I discussed TCAs in general with her, I think a trial of low-dose nortriptyline may be reasonable as she does have a history of migraine headaches as well.  I discussed potential side effects of nortriptyline.  We will start low-dose at 10 mg nightly and titrate up over time.  She will continue to follow-up with pelvic floor PT and UroGYN but again her prior manometry was normal.  We discussed as a last resort if her pain persist despite trials of multiple medications to treat this, and she does have some other options that we have not tried yet, she can consider another surgical evaluation for ex lap which has been offered to her in the past.  She wants to hold off on that unless there are no other options.  She can follow-up with me in a few months for reassessment.  Plan: - stop probiotics - trial of linzess 252mg / day to try first - trial of motegrity 220m/ day if fails higher dose Linzess  - start Notriptyline 1068m HS - will treat for pain and migraine headaches - follow up with pelvic floor PT - ex-lap as last resort if symptoms persist over time, hopefully that is not needed - follow up in  a few months for reassessment  [place] Cellar, MD [facility]

## 2020-06-16 NOTE — Patient Instructions (Addendum)
If you are age 56 or older, your body mass index should be between 23-30. Your Body mass index is 27.63 kg/m. If this is out of the aforementioned range listed, please consider follow up with your Primary Care Provider.  If you are age 47 or younger, your body mass index should be between 19-25. Your Body mass index is 27.63 kg/m. If this is out of the aformentioned range listed, please consider follow up with your Primary Care Provider.   We have given you samples of the following medication to take: Linzess 290 mcg: take once daily in the morning before breakfast  If this is not effective STOP linzess and start samples of Motegrity 2 mg we are giving you today: Take once daily.  Do not take Linzess and Motegrity at the same time  Discontinue taking probiotic  We have sent the following medications to your pharmacy for you to pick up at your convenience: Nortriptyline (Pamelor) 10 mg: Take daily at bedtime  Thank you for entrusting me with your care and for choosing [employer], Dr. [place] Cellar

## 2020-10-28 NOTE — Progress Notes (Signed)
[place] Phone: [phone] Subjective:   Fontaine No, am serving as a scribe for Dr. Hulan Saas.  This visit occurred during the SARS-CoV-2 public health emergency.  Safety protocols were in place, including screening questions prior to the visit, additional usage of staff PPE, and extensive cleaning of exam room while observing appropriate contact time as indicated for disinfecting solutions.   I'm seeing this patient by the request  of:  Emelda Fear, DO  CC: left shoulder pain   [id number]  Saw Dr. Georgina Snell in Feb 2022 for same complaint. No xray  Melyna Huron is a 55 y.o. female coming in with complaint of left shoulder pain. Patient had injection from Dr. Georgina Snell. Also PCP injected shoulder 3 months ago. Pain in upper arm that radiates into posterior aspect of shoulder. Notes tightness and pain in L side of c-spine. Also feels that there is swelling over L clavicle. Pain increases with elbow flexion. Unable to sleep due to pain.  Patient did bring an MRI of the neck.  This was independently visualized by me showing the patient does have some osteophyte formation at C5-C6 causing a left-sided foraminal stenosis.  Patient does not know if she is having significant neck pain versus the possibility of this being the shoulder.  Patient is hoping to get a little more information.  Patient is undergoing a pelvic surgery in the near future and will still cannot do any steroid but would like something that would definitely help her sleep       Past Medical History:  Diagnosis Date   Anemia    Asthma    Bilateral ovarian cysts    CHF (congestive heart failure) (HCC)    Chronic appendicitis    Chronic migraine without aura    Colon polyps    Diverticulosis    Essential hypertension    Factor V Leiden mutation ([place])    Gallstones    Hepatic steatosis 12/2019   CT   IBS (irritable bowel syndrome)    Kidney  stones    Mixed hyperlipidemia    Obstructive sleep apnea of adult    CPAP   Prinzmetal angina (HCC)    Psoriasis    Skin cancer    Stroke ([place])    Type II diabetes mellitus ([place])    Past Surgical History:  Procedure Laterality Date   CHOLECYSTECTOMY     OOPHORECTOMY Left    TONSILLECTOMY     TUBAL LIGATION  1991   Social History   Socioeconomic History   Marital status: Married    Spouse name: Not on file   Number of children: 2   Years of education: Not on file   Highest education level: Not on file  Occupational History   Occupation: Home maker  Tobacco Use   Smoking status: Never   Smokeless tobacco: Never  Vaping Use   Vaping Use: Never used  Substance and Sexual Activity   Alcohol use: Yes    Comment: rarely-once a year   Drug use: Never   Sexual activity: Not on file  Other Topics Concern   Not on file  Social History Narrative   Not on file   Social Determinants of Health   Financial Resource Strain: Not on file  Food Insecurity: Not on file  Transportation Needs: Not on file  Physical Activity: Not on file  Stress: Not on file  Social Connections: Not on file   Allergies  Allergen Reactions   Penicillins Anaphylaxis and Other (See Comments)   Beef Allergy    Codeine Other (See Comments)    Spasms    Gluten Meal Other (See Comments)   Lactose Intolerance (Gi)    Meperidine Hcl    Sulfa Antibiotics Other (See Comments)    Cramping and bloating    Family History  Problem Relation Age of Onset   Hypertension Mother    Alzheimer's disease Mother    Colon polyps Mother    Factor V Leiden deficiency Mother    Deep vein thrombosis Mother    Heart disease Mother    Hypertension Father    Heart disease Father    Multiple sclerosis Sister    Alzheimer's disease Maternal Grandmother    Colon polyps Brother    Diabetes Brother    Liver disease Maternal Grandfather    Liver disease Paternal Grandfather    Liver disease Paternal Uncle     Colon polyps Sister    Factor V Leiden deficiency Sister    Celiac disease Niece    Ulcerative colitis Niece    Irritable bowel syndrome Niece    Colon cancer Neg Hx    Pancreatic cancer Neg Hx    Esophageal cancer Neg Hx     Current Outpatient Medications (Endocrine & Metabolic):    metFORMIN (GLUCOPHAGE) 500 MG tablet, Take by mouth.  Current Outpatient Medications (Cardiovascular):    EPINEPHrine 0.3 mg/0.3 mL IJ SOAJ injection, as needed.   hydrochlorothiazide (HYDRODIURIL) 25 MG tablet, Take 25 mg by mouth daily.   simvastatin (ZOCOR) 10 MG tablet, Take by mouth.   Current Outpatient Medications (Analgesics):    acetaminophen (TYLENOL) 500 MG tablet, Take 500 mg by mouth as needed.   aspirin EC 81 MG tablet, Take 81 mg by mouth daily. Swallow whole.   Ibuprofen 200 MG CAPS, Take by mouth as needed.   meloxicam (MOBIC) 15 MG tablet, Take 1 tablet (15 mg total) by mouth daily.   OTEZLA 30 MG TABS, Take 1 tablet by mouth 2 (two) times daily.   Current Outpatient Medications (Other):    Cholecalciferol (VITAMIN D3) 50 MCG (2000 UT) CAPS, Take by mouth daily.   Coenzyme Q10 (COQ-10) 100 MG CAPS, Take 1 capsule by mouth daily.   gabapentin (NEURONTIN) 100 MG capsule, Take 2 capsules (200 mg total) by mouth at bedtime.   linaclotide (LINZESS) 290 MCG CAPS capsule, Take 1 capsule (290 mcg total) by mouth daily before breakfast. Exp: 07-2020, [id number]   Magnesium Gluconate (MAGNESIUM 27 PO), Take by mouth.   methylcellulose (CITRUCEL) oral powder, Take as directed, Daily   Multiple Vitamin (MULTIVITAMIN) tablet, Take 1 tablet by mouth daily.   nortriptyline (PAMELOR) 10 MG capsule, Take 1 capsule (10 mg total) by mouth at bedtime.   Omega-3 Fatty Acids (FISH OIL) 1000 MG CAPS, Take 2 capsules by mouth 2 (two) times daily.   Peppermint Oil (IBGARD) 90 MG CPCR, Use as directed   Prucalopride Succinate (MOTEGRITY) 2 MG TABS, Take 1 tablet (2 mg total) by mouth daily.   TURMERIC  CURCUMIN PO, Take 1 tablet by mouth daily.   Reviewed prior external information including notes and imaging from  primary care provider As well as notes that were available from care everywhere and other healthcare systems.  Past medical history, social, surgical and family history all reviewed in electronic medical record.  No pertanent information unless stated regarding to the chief complaint.   Review of Systems:  No headache,  visual changes, nausea, vomiting, diarrhea, constipation, dizziness, , skin rash, fevers, chills, night sweats, weight loss, swollen lymph nodes,  joint swelling, chest pain, shortness of breath, mood changes. POSITIVE muscle aches, body aches, abdominal pain  Objective  Blood pressure 124/90, pulse 80, height 5' 3"  (1.6 m), weight 166 lb (75.3 kg), SpO2 98 %.   General: No apparent distress alert and oriented x3 mood and affect normal, dressed appropriately.  HEENT: Pupils equal, extraocular movements intact  Respiratory: Patient's speak in full sentences and does not appear short of breath  Cardiovascular: No lower extremity edema, non tender, no erythema  Gait normal with good balance and coordination.  MSK: Left shoulder shows positive impingement.  Difficulty with internal or external range of motion.  Patient does have 4-5 strength similar to the contralateral side.  Positive crossover also noted. Neck exam does have loss of lordosis.  Fullness noted over the anterior aspect of the clavicle but no masses appreciated.  Questionable positive Spurling's noted.  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  Mild hypoechoic changes consistent with a mild subacromial bursitis.  Patient also has moderate arthritic changes with effusion of the acromioclavicular joint.   Impression and Recommendations:      The above documentation has been reviewed and is accurate and complete Lyndal Pulley, DO

## 2020-11-02 ENCOUNTER — Other Ambulatory Visit: Payer: Self-pay

## 2020-11-02 ENCOUNTER — Encounter: Payer: Self-pay | Admitting: Family Medicine

## 2020-11-02 ENCOUNTER — Ambulatory Visit: Payer: Self-pay

## 2020-11-02 ENCOUNTER — Other Ambulatory Visit: Payer: Self-pay | Admitting: Family Medicine

## 2020-11-02 ENCOUNTER — Ambulatory Visit: Payer: Federal, State, Local not specified - PPO | Admitting: Family Medicine

## 2020-11-02 VITALS — BP 124/90 | HR 80 | Ht 63.0 in | Wt 166.0 lb

## 2020-11-02 DIAGNOSIS — G8929 Other chronic pain: Secondary | ICD-10-CM

## 2020-11-02 DIAGNOSIS — M25512 Pain in left shoulder: Secondary | ICD-10-CM | POA: Diagnosis not present

## 2020-11-02 MED ORDER — MELOXICAM 15 MG PO TABS
15.0000 mg | ORAL_TABLET | Freq: Every day | ORAL | 0 refills | Status: DC
Start: 2020-11-02 — End: 2020-11-03

## 2020-11-02 MED ORDER — GABAPENTIN 100 MG PO CAPS: 200.0000 mg | ORAL_CAPSULE | Freq: Every day | ORAL | 0 refills | Status: DC

## 2020-11-02 NOTE — Patient Instructions (Addendum)
Meloxicam 47m daily for 10 days then as needed Gabapentin 2080mat night can increase otherwise See me again in 2 months MRI L shoulder AC joint arthropathy

## 2020-11-02 NOTE — Assessment & Plan Note (Signed)
Patient is having left shoulder pain.  On ultrasound today does have what appears to be some degenerative changes of the supraspinatus.  Patient is having some signs that is consistent with possible cervical radiculopathy and does have an MRI of the cervical spine showing a C5-C6 possible impingement.  Patient is to avoid any type of steroids with her having surgery in the next month.  We discussed the gabapentin.  Given a short course of anti-inflammatory that I also think will be helpful.  Patient has been taking ibuprofen and naproxen regularly told her to discontinue Mobic.  Discussed with patient there is no patient does have Any improvement to discontinue.  We will start gabapentin to help also with some nighttime pain and some of the radicular symptoms.  Do feel at this point patient has failed all other conservative therapy with the shoulder including acute injections, oral anti-inflammatories as well as exercises.  Physical therapy patient will then have the MRI and depending on findings.  Discussed the possibility of care thereafter

## 2020-11-10 ENCOUNTER — Encounter: Payer: Self-pay | Admitting: Family Medicine

## 2020-11-16 ENCOUNTER — Telehealth: Payer: Self-pay | Admitting: Family Medicine

## 2020-11-16 NOTE — Telephone Encounter (Signed)
Patient scheduled for next week for PRP.

## 2020-11-16 NOTE — Telephone Encounter (Signed)
Pt wants to do the PRP, she asked if Dextrose was part of this procedure.

## 2020-11-19 NOTE — Progress Notes (Signed)
[place] Phone: [phone] Subjective:   Theresa Gonzales, am serving as a scribe for Dr. Hulan Saas. This visit occurred during the SARS-CoV-2 public health emergency.  Safety protocols were in place, including screening questions prior to the visit, additional usage of staff PPE, and extensive cleaning of exam room while observing appropriate contact time as indicated for disinfecting solutions.   I'm seeing this patient by the request  of:  Emelda Fear, DO  CC: Left shoulder pain follow-up  [id number]  11/02/2020 Patient is having left shoulder pain.  On ultrasound today does have what appears to be some degenerative changes of the supraspinatus.  Patient is having some signs that is consistent with possible cervical radiculopathy and does have an MRI of the cervical spine showing a C5-C6 possible impingement.  Patient is to avoid any type of steroids with her having surgery in the next month.  We discussed the gabapentin.  Given a short course of anti-inflammatory that I also think will be helpful.  Patient has been taking ibuprofen and naproxen regularly told her to discontinue Mobic.  Discussed with patient there is Gonzales patient does have Any improvement to discontinue.  We will start gabapentin to help also with some nighttime pain and some of the radicular symptoms.  Do feel at this point patient has failed all other conservative therapy with the shoulder including acute injections, oral anti-inflammatories as well as exercises.  Physical therapy patient will then have the MRI and depending on findings.  Discussed the possibility of care thereafter   Updated 11/22/2020 Theresa Gonzales is a 55 y.o. female coming in with complaint of left shoulder pain. PRP today.  Patient did have an outside MRI where patient did bring disc as well as having the report.  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  Patient wants to avoid any surgical intervention if possible.  Patient is here for PRP.    Past Medical History:  Diagnosis Date   Anemia    Asthma    Bilateral ovarian cysts    CHF (congestive heart failure) (HCC)    Chronic appendicitis    Chronic migraine without aura    Colon polyps    Diverticulosis    Essential hypertension    Factor V Leiden mutation ([place])    Gallstones    Hepatic steatosis 12/2019   CT   IBS (irritable bowel syndrome)    Kidney stones    Mixed hyperlipidemia    Obstructive sleep apnea of adult    CPAP   Prinzmetal angina (HCC)    Psoriasis    Skin cancer    Stroke ([place])    Type II diabetes mellitus ([place])    Past Surgical History:  Procedure Laterality Date   CHOLECYSTECTOMY     OOPHORECTOMY Left    TONSILLECTOMY     TUBAL LIGATION  1991   Social History   Socioeconomic History   Marital status: Married    Spouse name: Not on file   Number of children: 2   Years of education: Not on file   Highest education level: Not on file  Occupational History   Occupation: Home maker  Tobacco Use   Smoking status: Never   Smokeless tobacco: Never  Vaping Use   Vaping Use: Never used  Substance and Sexual Activity   Alcohol use:  Yes    Comment: rarely-once a year   Drug use: Never   Sexual activity: Not on file  Other Topics Concern   Not on file  Social History Narrative   Not on file   Social Determinants of Health   Financial Resource Strain: Not on file  Food Insecurity: Not on file  Transportation Needs: Not on file  Physical Activity: Not on file  Stress: Not on file  Social Connections: Not on file   Allergies  Allergen Reactions   Penicillins Anaphylaxis and Other (See Comments)   Beef Allergy    Codeine Other (See Comments)    Spasms    Gluten Meal Other (See Comments)   Lactose Intolerance (Gi)    Meperidine Hcl    Sulfa  Antibiotics Other (See Comments)    Cramping and bloating    Family History  Problem Relation Age of Onset   Hypertension Mother    Alzheimer's disease Mother    Colon polyps Mother    Factor V Leiden deficiency Mother    Deep vein thrombosis Mother    Heart disease Mother    Hypertension Father    Heart disease Father    Multiple sclerosis Sister    Alzheimer's disease Maternal Grandmother    Colon polyps Brother    Diabetes Brother    Liver disease Maternal Grandfather    Liver disease Paternal Grandfather    Liver disease Paternal Uncle    Colon polyps Sister    Factor V Leiden deficiency Sister    Celiac disease Niece    Ulcerative colitis Niece    Irritable bowel syndrome Niece    Colon cancer Neg Hx    Pancreatic cancer Neg Hx    Esophageal cancer Neg Hx     Current Outpatient Medications (Endocrine & Metabolic):    metFORMIN (GLUCOPHAGE) 500 MG tablet, Take by mouth.  Current Outpatient Medications (Cardiovascular):    EPINEPHrine 0.3 mg/0.3 mL IJ SOAJ injection, as needed.   hydrochlorothiazide (HYDRODIURIL) 25 MG tablet, Take 25 mg by mouth daily.   simvastatin (ZOCOR) 10 MG tablet, Take by mouth.   Current Outpatient Medications (Analgesics):    acetaminophen (TYLENOL) 500 MG tablet, Take 500 mg by mouth as needed.   aspirin EC 81 MG tablet, Take 81 mg by mouth daily. Swallow whole.   Ibuprofen 200 MG CAPS, Take by mouth as needed.   meloxicam (MOBIC) 15 MG tablet, TAKE 1 TABLET(15 MG) BY MOUTH DAILY   OTEZLA 30 MG TABS, Take 1 tablet by mouth 2 (two) times daily.   Current Outpatient Medications (Other):    Cholecalciferol (VITAMIN D3) 50 MCG (2000 UT) CAPS, Take by mouth daily.   Coenzyme Q10 (COQ-10) 100 MG CAPS, Take 1 capsule by mouth daily.   gabapentin (NEURONTIN) 100 MG capsule, Take 2 capsules (200 mg total) by mouth at bedtime.   linaclotide (LINZESS) 290 MCG CAPS capsule, Take 1 capsule (290 mcg total) by mouth daily before breakfast. Exp:  07-2020, [id number]   Magnesium Gluconate (MAGNESIUM 27 PO), Take by mouth.   methylcellulose (CITRUCEL) oral powder, Take as directed, Daily   Multiple Vitamin (MULTIVITAMIN) tablet, Take 1 tablet by mouth daily.   nortriptyline (PAMELOR) 10 MG capsule, Take 1 capsule (10 mg total) by mouth at bedtime.   Omega-3 Fatty Acids (FISH OIL) 1000 MG CAPS, Take 2 capsules by mouth 2 (two) times daily.   Peppermint Oil (IBGARD) 90 MG CPCR, Use as directed   Prucalopride Succinate (MOTEGRITY) 2 MG TABS,  Take 1 tablet (2 mg total) by mouth daily.   TURMERIC CURCUMIN PO, Take 1 tablet by mouth daily.   Reviewed prior external information including notes and imaging from  primary care provider As well as notes that were available from care everywhere and other healthcare systems.  Past medical history, social, surgical and family history all reviewed in electronic medical record.  Gonzales pertanent information unless stated regarding to the chief complaint.   Review of Systems:  Gonzales headache, visual changes, nausea, vomiting, diarrhea, constipation, dizziness, abdominal pain, skin rash, fevers, chills, night sweats, weight loss, swollen lymph nodes, body aches, joint swelling, chest pain, shortness of breath, mood changes. POSITIVE muscle aches  Objective  There were Gonzales vitals taken for this visit.   General: Gonzales apparent distress alert and oriented x3 mood and affect normal, dressed appropriately.  HEENT: Pupils equal, extraocular movements intact    Procedure: Real-time Ultrasound Guided Injection of left glenohumeral joint Device: GE Logiq E  Ultrasound guided injection is preferred based studies that show increased duration, increased effect, greater accuracy, decreased procedural pain, increased response rate with ultrasound guided versus blind injection.  Verbal informed consent obtained.  Time-out conducted.  Noted Gonzales overlying erythema, induration, or other signs of local infection.  Skin  prepped in a sterile fashion.  Local anesthesia: Topical Ethyl chloride.  With sterile technique and under real time ultrasound guidance:  Joint visualized.  21g 2 inch needle inserted lateral approach. Pictures taken for needle placement. Patient did have injection of 2 cc of 0.5% Marcaine, then injected with 5 cc of 3 centrifuge PRP Pain immediately resolved suggesting accurate placement of the medication.  Advised to call if fevers/chills, erythema, induration, drainage, or persistent bleeding.  Impression: Technically successful ultrasound guided injection.    Impression and Recommendations:     The above documentation has been reviewed and is accurate and complete Lyndal Pulley, DO

## 2020-11-22 ENCOUNTER — Ambulatory Visit: Payer: Self-pay | Admitting: Family Medicine

## 2020-11-22 ENCOUNTER — Ambulatory Visit: Payer: Self-pay

## 2020-11-22 ENCOUNTER — Encounter: Payer: Self-pay | Admitting: Family Medicine

## 2020-11-22 VITALS — BP 136/82 | HR 81 | Ht 63.0 in | Wt 167.0 lb

## 2020-11-22 DIAGNOSIS — M25512 Pain in left shoulder: Secondary | ICD-10-CM

## 2020-11-22 DIAGNOSIS — M75112 Incomplete rotator cuff tear or rupture of left shoulder, not specified as traumatic: Secondary | ICD-10-CM

## 2020-11-22 DIAGNOSIS — M75102 Unspecified rotator cuff tear or rupture of left shoulder, not specified as traumatic: Secondary | ICD-10-CM | POA: Insufficient documentation

## 2020-11-22 DIAGNOSIS — G8929 Other chronic pain: Secondary | ICD-10-CM

## 2020-11-22 NOTE — Patient Instructions (Addendum)
PRP injection today Follow instructions See you again in 6 weeks

## 2020-11-22 NOTE — Assessment & Plan Note (Signed)
Patient is elected to try the PRP.  Warned the potential side effects.  Patient did not tolerate the procedure very well though.  Discussed icing regimen and home exercises.  Discussed which activities to do which wants to avoid.  Increase activity slowly.  We discussed the PRP post procedure care.

## 2020-11-24 ENCOUNTER — Ambulatory Visit: Payer: Federal, State, Local not specified - PPO | Admitting: Family Medicine

## 2020-11-29 ENCOUNTER — Other Ambulatory Visit: Payer: Self-pay | Admitting: Family Medicine

## 2020-12-27 ENCOUNTER — Ambulatory Visit: Payer: Federal, State, Local not specified - PPO | Admitting: Gastroenterology

## 2020-12-27 ENCOUNTER — Encounter: Payer: Self-pay | Admitting: Gastroenterology

## 2020-12-27 VITALS — BP 112/72 | HR 76 | Ht 63.0 in | Wt 168.8 lb

## 2020-12-27 DIAGNOSIS — R109 Unspecified abdominal pain: Secondary | ICD-10-CM | POA: Diagnosis not present

## 2020-12-27 DIAGNOSIS — K760 Fatty (change of) liver, not elsewhere classified: Secondary | ICD-10-CM

## 2020-12-27 DIAGNOSIS — K862 Cyst of pancreas: Secondary | ICD-10-CM | POA: Diagnosis not present

## 2020-12-27 DIAGNOSIS — K5909 Other constipation: Secondary | ICD-10-CM

## 2020-12-27 DIAGNOSIS — G8929 Other chronic pain: Secondary | ICD-10-CM

## 2020-12-27 NOTE — Patient Instructions (Addendum)
If you are age 56 or older, your body mass index should be between 23-30. Your Body mass index is 29.9 kg/m. If this is out of the aforementioned range listed, please consider follow up with your Primary Care Provider.  If you are age 67 or younger, your body mass index should be between 19-25. Your Body mass index is 29.9 kg/m. If this is out of the aformentioned range listed, please consider follow up with your Primary Care Provider.   ________________________________________________________  The [place] [facility] providers would like to encourage you to use [facility] to communicate with providers for non-urgent requests or questions.  Due to long hold times on the telephone, sending your provider a message by [facility] may be a faster and more efficient way to get a response.  Please allow 48 business hours for a response.  Please remember that this is for non-urgent requests.  _______________________________________________________  We are giving you a Low-FODMAP diet handout today. FODMAPs are short-chain carbohydrates (sugars) that are highly fermentable, which means that they go through chemical changes in the GI system, and are poorly absorbed during digestion. When FODMAPs reach the colon (large intestine), bacteria ferment these sugars, turning them into gas and chemicals. This stretches the walls of the colon, causing abdominal bloating, distension, cramping, pain, and/or changes in bowel habits in many patients with IBS. FODMAPs are not unhealthy or harmful, but may exacerbate GI symptoms in those with sensitive GI tracts.   We are referring you for Acupuncture.  They will contact you directly to schedule an appointment.  It may take a week or more before you hear from them.  Please feel free to contact us if you have not heard from them within 2 weeks and we will follow up on the referral.    You will be due for a recall MRCP in 04-2021. We will send you a reminder in the mail when it gets closer to  that time.  Thank you for entrusting me with your care and for choosing [facility], Dr.  Cellar

## 2020-12-27 NOTE — Progress Notes (Signed)
HPI :  55 year old female with a history of chronic abdominal pain, fatty liver, factor V Leiden, chronic constipation, here for a follow up visit for abdominal pain. She was last seen 06/16/20 .   See prior clinic notes for full details of her case.  Recall that she has had chronic right lower quadrant pain since at least 2008.  She has had an extensive evaluation with imaging, colonoscopy, labs, and general surgery evaluation in the past.  She has had constipation in the past and thought this could be playing somewhat of a role but not the main cause.  She has had a constipated treated with Linzess previously which worked well, and unfortunately stopped working.  She has been using double dose MiraLAX and Citrucel daily and her bowels have been moving quite well, 2-3 times per day.  She states the pain is not as bad when her bowels are moving but it has not resolved her symptoms which largely persist.  She has had problems with rectocele and prolapse from her chronic constipation, has been seen by pelvic floor PT and followed by UroGYN.  She is scheduled to have repair of this in the next 1 to 2 months, she inquires about a combination procedure with a general surgeon for ex lap for her abdominal pain.  Her symptoms are essentially the same since have last seen her.  She has constant pain in her right lower quadrant that is present all the time and never goes away.  She does have some fluctuations of severity over time.  Usually its rated 2-3 out of 10 at baseline.  She does have some positional component that can make it better or worse but largely no other clear triggers for this.  It is not relieved with treatment of her constipation.  Eating does not make it worse.  She is concerned about a possible foreign body reaction from her prior gynecologic surgery, retained ring or suture from tubal ligation.  We have discussed the role of ex lap in the past for her symptoms. She was evaluated by Dr. Gardenia Phlegm for possible musculoskeletal pain causing this previously.  He did abdominal wall ultrasound and thought this could be multifactorial pain due to constipation, ovarian cyst, possible rectocele.  At her last visit we discussed medical therapies.  I have recommended a trial of nortriptyline to treat this and her migraine headaches.  After further consideration she declined against trying TCA, states she does not want any medication that would alter her brain chemistry etc.  She inquires about a low FODMAP diet and other ways to manage this.  Otherwise since have last seen her she did have an MRI of her abdomen ordered by another doctor.  Report as outlined below.  She does have fatty liver, her LFTs have been normal.  She has history of cholecystectomy with a CBD of 10 mm.  She states this is been noted on prior CTs dating back years and stable.  She incidentally had 2 small pancreatic cysts, largest 5 mm, thought to be IPMN.  Radiology recommended a repeat MRCP in 6 months to assess for stability.    Prior workup Colonoscopy - 01/27/20 - Dr. Sherryl Manges - left sided diverticulosis, hemorrhoids - good prep reported   [place] 01/05/20 - normal uterus, R ovary with 1.2cm cyst, otherwise normal   CT abdomen / pelvis 12/31/19 - fatty liver, normal appendix, normal pancreas, post cholecystectomy, normal CBD,    Xray abdomen 12/29/19 - normal  CT abdomen / pelvis with contrast 12/15/2016 - suspected 3.1 cm right ovary, appendix not visualized, bowel is normal, fatty liver, post chole   [place] vaginal 12/20/2017: unremarkable with normal appearing uterus (endometrium 3 mm thick), absent left ovary, and right ovary with only a simple follicle (normal finding as she is still menstruating regularly). No other pelvic mass is noted.   Anorectal manometry 2019 - [facility] - normal   Fecal calprotectin negative  Colonoscopy 10/22/2013: Cecal polyp, polypectomy (TA) bengin; Ascending colon polyp, polypectomy (TA)  benign; Cecum, polypectomy, prominent lymphoid aggregates. Moderate internal hemorrhoids noted.   Labs 12/31/19: - AST 24, ALT 27, AP 50, T bil 0.37 - BUN 20, Cr 0.93 - WBC 9.6, Hgb 14.6, MCV 94.6, plt 340   Labs 02/05/20: - AP 63, AST 38, ALT 39, normal renal function - WBC 7.31, Hgb 15.0, MCV 100, plt 381  MRI abdomen - 11/08/2020: Fatty liver, post cholecystectomy CBD dilated up to 58m with normal tapering, 551mand 3.51m57mancreatic body cyst, suspected IPMN, radiology recommending a follow up exam in 6 months   Past Medical History:  Diagnosis Date   Anemia    Asthma    Bilateral ovarian cysts    CHF (congestive heart failure) (HCC)    Chronic appendicitis    Chronic migraine without aura    Colon polyps    Diverticulosis    Essential hypertension    [place]  Gallstones    Hepatic steatosis 12/2019   CT   IBS (irritable bowel syndrome)    Kidney stones    Mixed hyperlipidemia    Obstructive sleep apnea of adult    CPAP   Prinzmetal angina (HCC)    Psoriasis    Skin cancer    Stroke ([place]  [place] mellitus [place]    Past Surgical History:  Procedure Laterality Date   CHOLECYSTECTOMY     OOPHORECTOMY Left    TONSILLECTOMY     TUBAL LIGATION  1991   Family History  Problem Relation Age of Onset   Hypertension Mother    Alzheimer's disease Mother    Colon polyps Mother    Factor V Leiden deficiency Mother    Deep vein thrombosis Mother    Heart disease Mother    Hypertension Father    Heart disease Father    Multiple sclerosis Sister    Alzheimer's disease Maternal Grandmother    Colon polyps Brother    Diabetes Brother    Liver disease Maternal Grandfather    Liver disease Paternal Grandfather    Liver disease Paternal Uncle    Colon polyps Sister    Factor V Leiden deficiency Sister    Celiac disease Niece    Ulcerative colitis Niece    Irritable bowel syndrome Niece    Colon cancer Neg Hx    Pancreatic  cancer Neg Hx    Esophageal cancer Neg Hx    Social History   Tobacco Use   Smoking status: Never   Smokeless tobacco: Never  Vaping Use   Vaping Use: Never used  Substance Use Topics   Alcohol use: Yes    Comment: rarely-once a year   Drug use: Never   Current Outpatient Medications  Medication Sig Dispense Refill   acetaminophen (TYLENOL) 500 MG tablet Take 500 mg by mouth as needed.     ASHWAGANDHA PO Take by mouth.     Cholecalciferol (VITAMIN D3) 50 MCG (2000 UT)  CAPS Take by mouth daily.     Coenzyme Q10 (COQ-10) 100 MG CAPS Take 1 capsule by mouth daily.     EPINEPHrine 0.3 mg/0.3 mL IJ SOAJ injection as needed.     FENUGREEK PO Take by mouth.     hydrochlorothiazide (HYDRODIURIL) 25 MG tablet Take 25 mg by mouth daily.     Ibuprofen 200 MG CAPS Take by mouth as needed.     linaclotide (LINZESS) 290 MCG CAPS capsule Take 1 capsule (290 mcg total) by mouth daily before breakfast. Exp: 07-2020, [id number] 12 capsule 0   Magnesium Gluconate (MAGNESIUM 27 PO) Take by mouth.     meloxicam (MOBIC) 15 MG tablet TAKE 1 TABLET(15 MG) BY MOUTH DAILY 90 tablet 0   methylcellulose (CITRUCEL) oral powder Take as directed, Daily     Multiple Vitamin (MULTIVITAMIN) tablet Take 1 tablet by mouth daily.     Multiple Vitamins-Minerals (ZINC PO) Take by mouth.     Omega-3 Fatty Acids (FISH OIL) 1000 MG CAPS Take 2 capsules by mouth 2 (two) times daily.     Peppermint Oil (IBGARD) 90 MG CPCR Use as directed     TURMERIC CURCUMIN PO Take 1 tablet by mouth daily.     liothyronine (CYTOMEL) 5 MCG tablet Take 5 mcg by mouth daily.     No current facility-administered medications for this visit.   Allergies  Allergen Reactions   Penicillins Anaphylaxis and Other (See Comments)   Beef Allergy    Codeine Other (See Comments)    Spasms    Gluten Meal Other (See Comments)   Lactose Intolerance (Gi)    Meperidine Hcl    Sulfa Antibiotics Other (See Comments)    Cramping and bloating       Review of Systems: All systems reviewed and negative except where noted in HPI.   See HPI for prior recent labs   Physical Exam: BP 112/72   Pulse 76   Ht 5' 3"  (1.6 m)   Wt 168 lb 12.8 oz (76.6 kg)   BMI 29.90 kg/m  Constitutional: Pleasant,well-developed, female in no acute distress. Neurological: Alert and oriented to person place and time. Psychiatric: Normal mood and affect. Behavior is normal.   ASSESSMENT AND PLAN: 55 year old female here for reassessment of the following:  Chronic abdominal pain Chronic constipation Pancreatic cyst Fatty liver  History as above, chronic right lower quadrant pain persisting for years without clear cause on imaging, colonoscopy etc.  We discussed differential diagnosis.  She very well could have visceral hypersensitivit causing this or nerve impingement/musculoskeletal pain.  Regarding medical therapy we discussed options for this which would include TCA or SNRI.  I think nortriptyline or Cymbalta is a reasonable option however we discussed this can alter brain chemistries and mood and she is not interested in pursuing these options due to this. She really is interested in pursuing an ex lap which has been discussed in the past.  She has surgery scheduled with UroGYN for repair of rectocele / prolapse, inquires about combining this with general surgeon for consideration of ex lap.  Her surgery is being done at [facility], thus she will have to be evaluated by general surgery there if she wants to pursue this.  I recommend she speak with her UroGYN surgeon to see what they recommend, if she wishes to pursue ex-lap I can refer her to general surgery if needed.  We discussed other nonmedical therapies for treatment of this.  I will refer her to an acupuncturist  to see if this may help, she wishes to proceed with that referral.  She was told by another provider about a low FODMAP diet we discussed that.  She does not have loose stools and not  convinced this will help her but if she wants to try it, provided her some handouts about it.  She is happy with her constipation regimen and will continue that for now, doing much better in this regard.  We reviewed her MRI findings, I do think she warrants a dedicated MRCP to further evaluate her pancreas, radiology recommending 6 months.  We will place recall for that, reassured her these appear benign and small, but will assess for interval changes.  CBD dilation appears to be related to postcholecystectomy and stable over years.  She states she has had LFTs within the past year which have been normal.  She is aware of fatty liver diagnosis and this will need to be monitored moving forward.  Plan: - referral for acupuncture therapy - she declines trial of TCA / SNRI - she will follow up with UroGYN to ask them about combined ex-lap with general surgery, I can refer her to general surgery if need be - she wants to trial low FODMAP diet, handouts provided - recall MRCP in April 2023 - continue bowel regimen for constipation - of note she had a colonoscopy that was normal last year. She inquires about repeating this for her screening. Based on the report she had a good prep, I think unlikely to show a cause for  her symptoms however if she is anxious about it we can reconsider doing it sooner. Will await her course with UroGYN / possible ex-lap first  I spent 35 minutes of time, including in depth chart review, face-to-face time with the patient, and documenting this encounter.   Jolly Mango, MD [facility]

## 2021-01-03 NOTE — Progress Notes (Signed)
[place] Phone: [phone] Subjective:   Fontaine No, am serving as a scribe for Dr. Hulan Saas. This visit occurred during the SARS-CoV-2 public health emergency.  Safety protocols were in place, including screening questions prior to the visit, additional usage of staff PPE, and extensive cleaning of exam room while observing appropriate contact time as indicated for disinfecting solutions.   I'm seeing this patient by the request  of:  Emelda Fear, DO  CC: Left shoulder pain follow-up  [id number]  11/22/2020 Patient is elected to try the PRP.  Warned the potential side effects.  Patient did not tolerate the procedure very well though.  Discussed icing regimen and home exercises.  Discussed which activities to do which wants to avoid.  Increase activity slowly.  We discussed the PRP post procedure care.  Updated 01/04/2021 Theresa Gonzales is a 55 y.o. female coming in with complaint of left shoulder pain. PRP f/u. Patient is having more pain than last visit. No new injury.        Past Medical History:  Diagnosis Date   Anemia    Asthma    Bilateral ovarian cysts    CHF (congestive heart failure) (HCC)    Chronic appendicitis    Chronic migraine without aura    Colon polyps    Diverticulosis    Essential hypertension    Factor V Leiden mutation ([place])    Gallstones    Hepatic steatosis 12/2019   CT   IBS (irritable bowel syndrome)    Kidney stones    Mixed hyperlipidemia    Obstructive sleep apnea of adult    CPAP   Prinzmetal angina (HCC)    Psoriasis    Skin cancer    Stroke ([place])    Type II diabetes mellitus ([place])    Past Surgical History:  Procedure Laterality Date   CHOLECYSTECTOMY     OOPHORECTOMY Left    TONSILLECTOMY     TUBAL LIGATION  1991   Social History   Socioeconomic History   Marital status: Married    Spouse name: Not on file   Number of children: 2   Years  of education: Not on file   Highest education level: Not on file  Occupational History   Occupation: Home maker  Tobacco Use   Smoking status: Never   Smokeless tobacco: Never  Vaping Use   Vaping Use: Never used  Substance and Sexual Activity   Alcohol use: Yes    Comment: rarely-once a year   Drug use: Never   Sexual activity: Not on file  Other Topics Concern   Not on file  Social History Narrative   Not on file   Social Determinants of Health   Financial Resource Strain: Not on file  Food Insecurity: Not on file  Transportation Needs: Not on file  Physical Activity: Not on file  Stress: Not on file  Social Connections: Not on file   Allergies  Allergen Reactions   Penicillins Anaphylaxis and Other (See Comments)   Beef Allergy    Codeine Other (See Comments)    Spasms    Gluten Meal Other (See Comments)   Lactose Intolerance (Gi)    Meperidine Hcl    Sulfa Antibiotics Other (See Comments)    Cramping and bloating    Family History  Problem Relation Age of Onset   Hypertension Mother    Alzheimer's disease Mother    Colon polyps Mother  Factor V Leiden deficiency Mother    Deep vein thrombosis Mother    Heart disease Mother    Hypertension Father    Heart disease Father    Multiple sclerosis Sister    Alzheimer's disease Maternal Grandmother    Colon polyps Brother    Diabetes Brother    Liver disease Maternal Grandfather    Liver disease Paternal Grandfather    Liver disease Paternal Uncle    Colon polyps Sister    Factor V Leiden deficiency Sister    Celiac disease Niece    Ulcerative colitis Niece    Irritable bowel syndrome Niece    Colon cancer Neg Hx    Pancreatic cancer Neg Hx    Esophageal cancer Neg Hx     Current Outpatient Medications (Endocrine & Metabolic):    liothyronine (CYTOMEL) 5 MCG tablet, Take 5 mcg by mouth daily.  Current Outpatient Medications (Cardiovascular):    EPINEPHrine 0.3 mg/0.3 mL IJ SOAJ injection, as  needed.   hydrochlorothiazide (HYDRODIURIL) 25 MG tablet, Take 25 mg by mouth daily.   Current Outpatient Medications (Analgesics):    acetaminophen (TYLENOL) 500 MG tablet, Take 500 mg by mouth as needed.   Ibuprofen 200 MG CAPS, Take by mouth as needed.   meloxicam (MOBIC) 15 MG tablet, TAKE 1 TABLET(15 MG) BY MOUTH DAILY   Current Outpatient Medications (Other):    ASHWAGANDHA PO, Take by mouth.   Cholecalciferol (VITAMIN D3) 50 MCG (2000 UT) CAPS, Take by mouth daily.   Coenzyme Q10 (COQ-10) 100 MG CAPS, Take 1 capsule by mouth daily.   FENUGREEK PO, Take by mouth.   linaclotide (LINZESS) 290 MCG CAPS capsule, Take 1 capsule (290 mcg total) by mouth daily before breakfast. Exp: 07-2020, [id number]   Magnesium Gluconate (MAGNESIUM 27 PO), Take by mouth.   methylcellulose (CITRUCEL) oral powder, Take as directed, Daily   Multiple Vitamin (MULTIVITAMIN) tablet, Take 1 tablet by mouth daily.   Multiple Vitamins-Minerals (ZINC PO), Take by mouth.   Omega-3 Fatty Acids (FISH OIL) 1000 MG CAPS, Take 2 capsules by mouth 2 (two) times daily.   Peppermint Oil (IBGARD) 90 MG CPCR, Use as directed   TURMERIC CURCUMIN PO, Take 1 tablet by mouth daily.   Reviewed prior external information including notes and imaging from  primary care provider As well as notes that were available from care everywhere and other healthcare systems.  Past medical history, social, surgical and family history all reviewed in electronic medical record.  No pertanent information unless stated regarding to the chief complaint.   Review of Systems:  No headache, visual changes, nausea, vomiting, diarrhea, constipation, dizziness, abdominal pain, skin rash, fevers, chills, night sweats, weight loss, swollen lymph nodes, body aches, joint swelling, chest pain, shortness of breath, mood changes. POSITIVE muscle aches  Objective  Blood pressure (!) 142/98, pulse 86, height 5' 3"  (1.6 m), weight 168 lb (76.2 kg), SpO2 99  %.   General: No apparent distress alert and oriented x3 mood and affect normal, dressed appropriately.  HEENT: Pupils equal, extraocular movements intact  Respiratory: Patient's speak in full sentences and does not appear short of breath  Cardiovascular: No lower extremity edema, non tender, no erythema  Gait normal with good balance and coordination.  MSK: Left shoulder exam shows the patient does have difficulty with all range of motion at the moment secondary to voluntary guarding.  Positive crossover noted.  Rotator cuff strength does appear to be intact but patient once again does have significant  guarding.  Worsening pain with internal rotation with arm at sacrum.  Limited muscular skeletal ultrasound was performed and interpreted by Hulan Saas, M  Limited ultrasound shows the patient does have a decrease in hypoechoic changes but still does have the intersubstance tearing noted with a high-grade partial tear noted of the supraspinatus.  Patient does have hypoechoic changes that is moderate of the acromioclavicular joint.  Patient does have potentially proximal bicep tendinitis with mild hypoechoic changes noted as well. 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

## 2021-01-04 ENCOUNTER — Other Ambulatory Visit: Payer: Self-pay

## 2021-01-04 ENCOUNTER — Ambulatory Visit (INDEPENDENT_AMBULATORY_CARE_PROVIDER_SITE_OTHER): Payer: Federal, State, Local not specified - PPO | Admitting: Family Medicine

## 2021-01-04 ENCOUNTER — Ambulatory Visit: Payer: Self-pay

## 2021-01-04 ENCOUNTER — Encounter: Payer: Self-pay | Admitting: Family Medicine

## 2021-01-04 VITALS — BP 142/98 | HR 86 | Ht 63.0 in | Wt 168.0 lb

## 2021-01-04 DIAGNOSIS — G8929 Other chronic pain: Secondary | ICD-10-CM | POA: Diagnosis not present

## 2021-01-04 DIAGNOSIS — M75112 Incomplete rotator cuff tear or rupture of left shoulder, not specified as traumatic: Secondary | ICD-10-CM | POA: Diagnosis not present

## 2021-01-04 DIAGNOSIS — M25512 Pain in left shoulder: Secondary | ICD-10-CM | POA: Diagnosis not present

## 2021-01-04 NOTE — Patient Instructions (Signed)
Sorry PRP did not work [employer] joint injection Can refer to surgeon Take mm relaxer, Tylenol, and IBU Let me know if you have questions

## 2021-01-04 NOTE — Assessment & Plan Note (Signed)
Discussed with patient at great length.  Do see some swelling of the acromioclavicular joint believe patient's symptoms especially with the bicep do not think that this is potentially contributing.  Patient still has tearing noted on ultrasound and this is consistent with the MRI.  Do not feel that the PRP is making significant difference. Patient discussed the possibility of surgical intervention with a surgeon at this time.  This is affecting daily activities as well as patient's sleep.  Follow-up with me more than as-needed basis.  Discussed with patient as well as reviewed patient's MRI for greater than 30 minutes today.

## 2021-01-17 ENCOUNTER — Encounter: Payer: Self-pay | Admitting: Family Medicine

## 2021-02-20 ENCOUNTER — Emergency Department (HOSPITAL_COMMUNITY)
Admission: EM | Admit: 2021-02-20 | Discharge: 2021-02-20 | Disposition: A | Discharge status: Home / Self Care | Payer: Federal, State, Local not specified - PPO | Attending: Emergency Medicine | Admitting: Emergency Medicine

## 2021-02-20 ENCOUNTER — Emergency Department (HOSPITAL_COMMUNITY): Payer: Federal, State, Local not specified - PPO

## 2021-02-20 ENCOUNTER — Encounter (HOSPITAL_COMMUNITY): Payer: Self-pay

## 2021-02-20 DIAGNOSIS — I509 Heart failure, unspecified: Secondary | ICD-10-CM | POA: Insufficient documentation

## 2021-02-20 DIAGNOSIS — E119 Type 2 diabetes mellitus without complications: Secondary | ICD-10-CM | POA: Diagnosis not present

## 2021-02-20 DIAGNOSIS — R203 Hyperesthesia: Secondary | ICD-10-CM | POA: Insufficient documentation

## 2021-02-20 DIAGNOSIS — R1012 Left upper quadrant pain: Secondary | ICD-10-CM | POA: Insufficient documentation

## 2021-02-20 DIAGNOSIS — R109 Unspecified abdominal pain: Secondary | ICD-10-CM

## 2021-02-20 LAB — CBC WITH DIFFERENTIAL/PLATELET
Abs Immature Granulocytes: 0.03 10*3/uL (ref 0.00–0.07)
Basophils Absolute: 0 10*3/uL (ref 0.0–0.1)
Basophils Relative: 1 %
Eosinophils Absolute: 0.1 10*3/uL (ref 0.0–0.5)
Eosinophils Relative: 1 %
HCT: 42.6 % (ref 36.0–46.0)
Hemoglobin: 14.5 g/dL (ref 12.0–15.0)
Immature Granulocytes: 0 %
Lymphocytes Relative: 22 %
Lymphs Abs: 1.9 10*3/uL (ref 0.7–4.0)
MCH: 33.3 pg (ref 26.0–34.0)
MCHC: 34 g/dL (ref 30.0–36.0)
MCV: 97.9 fL (ref 80.0–100.0)
Monocytes Absolute: 0.8 10*3/uL (ref 0.1–1.0)
Monocytes Relative: 9 %
Neutro Abs: 5.9 10*3/uL (ref 1.7–7.7)
Neutrophils Relative %: 67 %
Platelets: 317 10*3/uL (ref 150–400)
RBC: 4.35 MIL/uL (ref 3.87–5.11)
RDW: 12.6 % (ref 11.5–15.5)
WBC: 8.7 10*3/uL (ref 4.0–10.5)
nRBC: 0 % (ref 0.0–0.2)

## 2021-02-20 LAB — URINALYSIS, ROUTINE W REFLEX MICROSCOPIC
Bacteria, UA: NONE SEEN
Bilirubin Urine: NEGATIVE
Glucose, UA: NEGATIVE mg/dL
Ketones, ur: NEGATIVE mg/dL
Leukocytes,Ua: NEGATIVE
Nitrite: NEGATIVE
Protein, ur: NEGATIVE mg/dL
Specific Gravity, Urine: 1.004 — ABNORMAL LOW (ref 1.005–1.030)
pH: 7 (ref 5.0–8.0)

## 2021-02-20 LAB — COMPREHENSIVE METABOLIC PANEL
ALT: 21 U/L (ref 0–44)
AST: 21 U/L (ref 15–41)
Albumin: 4.5 g/dL (ref 3.5–5.0)
Alkaline Phosphatase: 55 U/L (ref 38–126)
Anion gap: 10 (ref 5–15)
BUN: 16 mg/dL (ref 6–20)
CO2: 27 mmol/L (ref 22–32)
Calcium: 9.5 mg/dL (ref 8.9–10.3)
Chloride: 100 mmol/L (ref 98–111)
Creatinine, Ser: 0.81 mg/dL (ref 0.44–1.00)
GFR, Estimated: >60 mL/min (ref >60)
Glucose, Bld: 116 mg/dL — ABNORMAL HIGH (ref 70–99)
Potassium: 3.4 mmol/L — ABNORMAL LOW (ref 3.5–5.1)
Sodium: 137 mmol/L (ref 135–145)
Total Bilirubin: 0.3 mg/dL (ref 0.3–1.2)
Total Protein: 7.8 g/dL (ref 6.5–8.1)

## 2021-02-20 LAB — PREGNANCY, URINE: Preg Test, Ur: NEGATIVE

## 2021-02-20 LAB — LIPASE, BLOOD: Lipase: 52 U/L — ABNORMAL HIGH (ref 11–51)

## 2021-02-20 NOTE — ED Notes (Signed)
Pt put in gown and on 12 lead monitor

## 2021-02-20 NOTE — ED Provider Notes (Signed)
[facility] EMERGENCY DEPARTMENT Provider Note   CSN: [id number] Arrival date & time: 02/20/21  1019     History  Chief Complaint  Patient presents with   Abdominal Pain    Theresa Gonzales is a 56 y.o. female.   Abdominal Pain Associated symptoms: no chest pain, no chills, no diarrhea, no fever, no nausea, no shortness of breath and no vomiting        Theresa Gonzales is a 56 y.o. female with past medical history of type 2 diabetes, factor V, CHF and kidney stones who presents to the Emergency Department complaining of persistent pain of the left flank x2 to 3 days.  She describes a hypersensitivity of the skin near the lower left rib cage.  Pain seems to be worse with movements.  No known injury or urinary symptoms.  States that she feels like something is "moving" in this area when she walks.  No chest pain or shortness of breath.  No abdominal pain fever, chills, nausea or vomiting.  She has not tried any over-the-counter medications for symptomatic relief she is scheduled for shoulder surgery later this week.   Home Medications Prior to Admission medications   Medication Sig Start Date End Date Taking? Authorizing Provider  acetaminophen (TYLENOL) 500 MG tablet Take 500 mg by mouth as needed.    [provider]  ASHWAGANDHA PO Take by mouth.    [provider]  Cholecalciferol (VITAMIN D3) 50 MCG (2000 UT) CAPS Take by mouth daily.    [provider]  Coenzyme Q10 (COQ-10) 100 MG CAPS Take 1 capsule by mouth daily.    [provider]  EPINEPHrine 0.3 mg/0.3 mL IJ SOAJ injection as needed. 09/05/18   [provider]  FENUGREEK PO Take by mouth.    [provider]  hydrochlorothiazide (HYDRODIURIL) 25 MG tablet Take 25 mg by mouth daily. 09/25/18   [provider]  Ibuprofen 200 MG CAPS Take by mouth as needed.    [provider]  linaclotide Rolan Lipa) 290 MCG CAPS capsule Take 1 capsule (290 mcg total) by mouth daily  before breakfast. Exp: 07-2020, [id number] 06/16/20   Armbruster, Carlota Raspberry, MD  liothyronine (CYTOMEL) 5 MCG tablet Take 5 mcg by mouth daily. 12/21/20   [provider]  Magnesium Gluconate (MAGNESIUM 27 PO) Take by mouth.    [provider]  meloxicam (MOBIC) 15 MG tablet TAKE 1 TABLET(15 MG) BY MOUTH DAILY 12/01/20   Lyndal Pulley, DO  methylcellulose (CITRUCEL) oral powder Take as directed, Daily 02/12/20   Armbruster, Carlota Raspberry, MD  Multiple Vitamin (MULTIVITAMIN) tablet Take 1 tablet by mouth daily.    [provider]  Multiple Vitamins-Minerals (ZINC PO) Take by mouth.    [provider]  Omega-3 Fatty Acids (FISH OIL) 1000 MG CAPS Take 2 capsules by mouth 2 (two) times daily.    [provider]  Peppermint Oil (IBGARD) 90 MG CPCR Use as directed 02/12/20   Armbruster, Carlota Raspberry, MD  TURMERIC CURCUMIN PO Take 1 tablet by mouth daily.    [provider]      Allergies    Penicillins, Beef allergy, Codeine, Gluten meal, Lactose intolerance (gi), Meperidine hcl, and Sulfa antibiotics    Review of Systems   Review of Systems  Constitutional:  Negative for chills and fever.  Respiratory:  Negative for chest tightness and shortness of breath.   Cardiovascular:  Negative for chest pain.  Gastrointestinal:  Negative for abdominal pain, diarrhea, nausea  and vomiting.  Genitourinary:  Positive for flank pain. Negative for difficulty urinating and pelvic pain.  Neurological:  Negative for dizziness, weakness, numbness and headaches.  All other systems reviewed and are negative.  Physical Exam Updated Vital Signs BP 128/79    Pulse 80    Temp 98.9 F (37.2 C) (Oral)    Resp 14    Ht 5' 3"  (1.6 m)    Wt 76 kg    SpO2 98%    BMI 29.68 kg/m  Physical Exam Vitals and nursing note reviewed.  Constitutional:      General: She is not in acute distress.    Appearance: Normal appearance. She is well-developed. She is not ill-appearing or  toxic-appearing.  Cardiovascular:     Rate and Rhythm: Normal rate and regular rhythm.     Pulses: Normal pulses.  Pulmonary:     Effort: Pulmonary effort is normal.  Abdominal:     General: There is no distension.     Palpations: Abdomen is soft.     Tenderness: There is no abdominal tenderness. There is no right CVA tenderness or left CVA tenderness.     Comments: Mild tenderness to palpation of the left upper quadrant.  No guarding or rebound tenderness.  Musculoskeletal:        General: Normal range of motion.  Skin:    General: Skin is warm.     Capillary Refill: Capillary refill takes less than 2 seconds.     Findings: No rash.     Comments: No erythema or vesicles noted at the left chest flank area.  Neurological:     General: No focal deficit present.     Mental Status: She is alert.     Sensory: No sensory deficit.     Motor: No weakness.    ED Results / Procedures / Treatments   Labs (all labs ordered are listed, but only abnormal results are displayed) Labs Reviewed  URINALYSIS, ROUTINE W REFLEX MICROSCOPIC - Abnormal; Notable for the following components:      Result Value   Specific Gravity, Urine 1.004 (*)    Hgb urine dipstick LARGE (*)    All other components within normal limits  COMPREHENSIVE METABOLIC PANEL - Abnormal; Notable for the following components:   Potassium 3.4 (*)    Glucose, Bld 116 (*)    All other components within normal limits  LIPASE, BLOOD - Abnormal; Notable for the following components:   Lipase 52 (*)    All other components within normal limits  CBC WITH DIFFERENTIAL/PLATELET  PREGNANCY, URINE    EKG None  Radiology CT ABDOMEN PELVIS WO CONTRAST  Result Date: 02/20/2021 CLINICAL DATA:  Left flank and side pain. EXAM: CT ABDOMEN AND PELVIS WITHOUT CONTRAST TECHNIQUE: Multidetector CT imaging of the abdomen and pelvis was performed following the standard protocol without IV contrast. RADIATION DOSE REDUCTION: This exam was  performed according to the departmental dose-optimization program which includes automated exposure control, adjustment of the mA and/or kV according to patient size and/or use of iterative reconstruction technique. COMPARISON:  10/21/2013 CT scan from [facility]. FINDINGS: Lower chest: 4 mm peripheral right lower lobe nodule on 17/4 is stable since prior study consistent with benign etiology. Lower chest otherwise unremarkable. Hepatobiliary: The liver shows diffusely decreased attenuation suggesting fat deposition. No focal abnormality in the liver on this study without intravenous contrast. Gallbladder is surgically absent. No intrahepatic or extrahepatic biliary dilation. Pancreas: No focal mass lesion. No dilatation of the  main duct. No intraparenchymal cyst. No peripancreatic edema. Spleen: No splenomegaly. No focal mass lesion. Adrenals/Urinary Tract: No adrenal nodule or mass. Kidneys unremarkable. Specifically, no evidence for renal or ureteral stones. No secondary changes in either kidney ureter no bladder stones. Stomach/Bowel: Stomach is unremarkable. No gastric wall thickening. No evidence of outlet obstruction. Duodenum is normally positioned as is the ligament of Treitz. No small bowel wall thickening. No small bowel dilatation. The terminal ileum is normal. The appendix is not well visualized, but there is no edema or inflammation in the region of the cecum. No gross colonic mass. No colonic wall thickening. Vascular/Lymphatic: There is mild atherosclerotic calcification of the abdominal aorta without aneurysm. There is no gastrohepatic or hepatoduodenal ligament lymphadenopathy. No retroperitoneal or mesenteric lymphadenopathy. No pelvic sidewall lymphadenopathy. Reproductive: Unremarkable. Other: No intraperitoneal free fluid. Musculoskeletal: No worrisome lytic or sclerotic osseous abnormality. IMPRESSION: 1. No acute findings in the abdomen or pelvis. Specifically, no evidence for urinary  stone disease. No other findings to explain the patient's history of left side and flank pain. 2. Hepatic steatosis. 3. Aortic Atherosclerosis (ICD10-I70.0). Electronically Signed   By: Misty Stanley M.D.   On: 02/20/2021 12:41    Procedures Procedures    Medications Ordered in ED Medications - No data to display  ED Course/ Medical Decision Making/ A&P                           Medical Decision Making Amount and/or Complexity of Data Reviewed Labs: ordered. Radiology: ordered.   Patient here with 2 to 3-day history of pain of her left flank left upper quadrant area.  No known injury.  Pain is reproduced with walking and palpation.  No chest pain or shortness of breath.  Onset has been gradual.  On exam, patient well-appearing nontoxic.  Ambulatory with steady gait.  Vital signs reassuring.  No tachycardia tachypnea or hypoxia.  She denies having any dysuria although she does have history of kidney stones.  We will check labs and CT abdomen pelvis  Differential diagnosis includes kidney stone, pyelonephritis, acute intra-abdominal process, zoster, musculoskeletal injury  On recheck, patient resting comfortably.  She has been offered pain medications but declines.  Labs are overall reassuring.  No leukocytosis.  Electrolytes essentially unremarkable.  Lipase slightly elevated at 52.  LFTs are reassuring.  CT of the abdomen pelvis without acute findings to suggest origin of patient's symptoms.  I do not appreciate any skin changes on her exam although symptoms could represent early zoster.  Low clinical suspicion for acute process.  Patient appears appropriate for discharge home.  All questions were answered return precautions were discussed.  She will follow-up closely with PCP.          Final Clinical Impression(s) / ED Diagnoses Final diagnoses:  Flank pain    Rx / DC Orders ED Discharge Orders     None         Kem Parkinson, PA-C 02/20/21 1413    Godfrey Pick, MD 02/21/21 [id number]

## 2021-02-20 NOTE — Discharge Instructions (Signed)
You may try alternating ice and heat to the affected area.  You may also try over-the-counter lidocaine patches or icy hot patches.  As discussed, this may also be early symptoms of shingles.  Watch for redness or blistering to the skin.  Please follow-up with your primary care provider for recheck.  Return to emergency department for any new or worsening symptoms.

## 2021-02-20 NOTE — ED Triage Notes (Signed)
Pt complaining of left side pain, pt alert and oriented skin warm and dry

## 2021-03-01 ENCOUNTER — Other Ambulatory Visit: Payer: Self-pay | Admitting: Family Medicine

## 2021-04-27 ENCOUNTER — Other Ambulatory Visit: Payer: Self-pay

## 2021-04-27 DIAGNOSIS — K862 Cyst of pancreas: Secondary | ICD-10-CM

## 2021-04-27 NOTE — Progress Notes (Signed)
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 She has been scheduled at [facility] on Friday, 05-13-21 at 5:00 pm, to arrive at 4:30pm , NPO 4 hours.  Called patient and LM with details of scan and asked that she call back to confirm.  Sent MyChart message to the patient as well with number she can call to reschedule if she would like a different date. ? ?

## 2021-05-13 ENCOUNTER — Encounter (HOSPITAL_COMMUNITY): Payer: Self-pay

## 2021-05-13 ENCOUNTER — Ambulatory Visit (HOSPITAL_COMMUNITY): Admission: RE | Admit: 2021-05-13 | Payer: Federal, State, Local not specified - PPO | Source: Ambulatory Visit

## 2021-05-13 ENCOUNTER — Other Ambulatory Visit: Payer: Self-pay | Admitting: Gastroenterology

## 2021-05-13 DIAGNOSIS — K862 Cyst of pancreas: Secondary | ICD-10-CM

## 2021-07-20 ENCOUNTER — Telehealth: Payer: Self-pay

## 2021-07-20 NOTE — Telephone Encounter (Signed)
Patient had referral for sleep consultation. Office visit notes state patient recently had sleep study within the last 36yr. Unable to accept patient referral due to recent sleep testing. Referring office notified.

## 2021-10-13 ENCOUNTER — Telehealth: Payer: Self-pay | Admitting: Gastroenterology

## 2021-10-13 NOTE — Telephone Encounter (Signed)
Thanks Jan.  I see that she had 2 adenomas removed in 2015, then a normal exam in 2021 with a good prep.  I am not sure why she thinks she needs another colonoscopy now unless someone else had told her that.  She will need a good reason to have another exam at this point in time given the results of her last one, I do not think insurance would cover it without development of symptoms or poor prep in the last exam etc. however her prep was reportedly normal/good.

## 2021-10-13 NOTE — Telephone Encounter (Signed)
Pt is wondering whether she needs her colonoscopy done now since the last one wasn't done properly in [place] or if she needs to wait till 2031 according to the recall that's on file by Dr Havery Moros

## 2021-10-13 NOTE — Telephone Encounter (Signed)
Called and left detailed message for patient. According to OV from 11-2020 Dr. Havery Moros indicated her last colonoscopy was 01-27-20 and was normal, no polyps and a good prep. Therefore she would not be due until 12-2029. Asked patient to call back to provide further details to explain why she indicates it was not "done properly" and we can review with Dr. Havery Moros to clarify his recommendation. Of note if she has a copy of the colonoscopy report itself that would be helpful to get a copy for her chart.

## 2021-12-20 LAB — LAB REPORT - SCANNED: EGFR: 73.9

## 2022-03-13 ENCOUNTER — Other Ambulatory Visit (HOSPITAL_BASED_OUTPATIENT_CLINIC_OR_DEPARTMENT_OTHER): Payer: Self-pay | Admitting: Family Medicine

## 2022-03-13 ENCOUNTER — Ambulatory Visit (HOSPITAL_BASED_OUTPATIENT_CLINIC_OR_DEPARTMENT_OTHER)
Admission: RE | Admit: 2022-03-13 | Discharge: 2022-03-13 | Disposition: A | Discharge status: Home / Self Care | Payer: Federal, State, Local not specified - PPO | Source: Ambulatory Visit | Attending: Family Medicine | Admitting: Family Medicine

## 2022-03-13 ENCOUNTER — Encounter (HOSPITAL_BASED_OUTPATIENT_CLINIC_OR_DEPARTMENT_OTHER): Payer: Self-pay

## 2022-03-13 DIAGNOSIS — R1012 Left upper quadrant pain: Secondary | ICD-10-CM

## 2022-03-13 LAB — POCT I-STAT CREATININE: Creatinine, Ser: 0.8 mg/dL (ref 0.44–1.00)

## 2022-03-13 LAB — LAB REPORT - SCANNED: EGFR: 64.5

## 2022-03-13 MED ORDER — IOHEXOL 300 MG/ML  SOLN
100.0000 mL | Freq: Once | INTRAMUSCULAR | Status: AC | PRN
  Administered 2022-03-13: 85 mL via INTRAVENOUS

## 2022-03-14 ENCOUNTER — Other Ambulatory Visit: Payer: Self-pay | Admitting: Family Medicine

## 2022-03-14 DIAGNOSIS — E781 Pure hyperglyceridemia: Secondary | ICD-10-CM

## 2022-03-24 ENCOUNTER — Other Ambulatory Visit (HOSPITAL_COMMUNITY): Payer: Self-pay | Admitting: Family Medicine

## 2022-03-24 DIAGNOSIS — I359 Nonrheumatic aortic valve disorder, unspecified: Secondary | ICD-10-CM

## 2022-04-14 ENCOUNTER — Ambulatory Visit
Admission: RE | Admit: 2022-04-14 | Discharge: 2022-04-14 | Disposition: A | Discharge status: Home / Self Care | Payer: No Typology Code available for payment source | Source: Ambulatory Visit | Attending: Family Medicine | Admitting: Family Medicine

## 2022-04-14 DIAGNOSIS — E781 Pure hyperglyceridemia: Secondary | ICD-10-CM

## 2022-04-19 ENCOUNTER — Other Ambulatory Visit: Payer: Self-pay | Admitting: Internal Medicine

## 2022-04-19 DIAGNOSIS — Z8673 Personal history of transient ischemic attack (TIA), and cerebral infarction without residual deficits: Secondary | ICD-10-CM

## 2022-04-21 ENCOUNTER — Ambulatory Visit (HOSPITAL_COMMUNITY): Payer: Federal, State, Local not specified - PPO

## 2022-04-21 ENCOUNTER — Ambulatory Visit (HOSPITAL_BASED_OUTPATIENT_CLINIC_OR_DEPARTMENT_OTHER): Payer: Federal, State, Local not specified - PPO

## 2022-04-22 ENCOUNTER — Other Ambulatory Visit: Payer: Self-pay | Admitting: Family Medicine

## 2022-05-11 ENCOUNTER — Ambulatory Visit (HOSPITAL_COMMUNITY)
Admission: RE | Admit: 2022-05-11 | Discharge: 2022-05-11 | Disposition: A | Discharge status: Home / Self Care | Payer: Federal, State, Local not specified - PPO | Source: Ambulatory Visit | Attending: Internal Medicine | Admitting: Internal Medicine

## 2022-05-11 ENCOUNTER — Ambulatory Visit (HOSPITAL_BASED_OUTPATIENT_CLINIC_OR_DEPARTMENT_OTHER)
Admission: RE | Admit: 2022-05-11 | Discharge: 2022-05-11 | Disposition: A | Discharge status: Home / Self Care | Payer: Federal, State, Local not specified - PPO | Source: Ambulatory Visit | Attending: Family Medicine | Admitting: Family Medicine

## 2022-05-11 DIAGNOSIS — I350 Nonrheumatic aortic (valve) stenosis: Secondary | ICD-10-CM

## 2022-05-11 DIAGNOSIS — Z8673 Personal history of transient ischemic attack (TIA), and cerebral infarction without residual deficits: Secondary | ICD-10-CM | POA: Diagnosis present

## 2022-05-11 DIAGNOSIS — I359 Nonrheumatic aortic valve disorder, unspecified: Secondary | ICD-10-CM | POA: Insufficient documentation

## 2022-05-11 LAB — ECHOCARDIOGRAM COMPLETE
AR max vel: 1.05 cm2
AV Area VTI: 0.8 cm2
AV Area mean vel: 0.9 cm2
AV Mean grad: 12 mmHg
AV Peak grad: 23 mmHg
Ao pk vel: 2.4 m/s
Area-P 1/2: 4.6 cm2
Calc EF: 56.1 %
S' Lateral: 2.4 cm
Single Plane A2C EF: 62.5 %
Single Plane A4C EF: 46.6 %

## 2022-05-17 ENCOUNTER — Telehealth: Payer: Self-pay

## 2022-05-17 NOTE — Telephone Encounter (Signed)
Received VM from patient requesting to schedule off referral that was sent to our office for sleep. Called pt back and let her know that I do not have a current referral for her and the last one in our system we received was from June 2023 and it was declined due to mention of prior sleep study within the last 3 years. I asked pt if she remembered when that sleep study was, if it had been more than 3 years at this point and the patient was unsure. She just stated she remembered it was at home and during COVID. I let the patient know if she would like to have [facility] send a referral to our office again she may, but scheduling would depend on if that last sleep study was in the last 3 years or not. Patient agreeable.

## 2022-05-29 ENCOUNTER — Encounter (HOSPITAL_BASED_OUTPATIENT_CLINIC_OR_DEPARTMENT_OTHER): Payer: Self-pay | Admitting: Internal Medicine

## 2022-05-29 ENCOUNTER — Encounter (HOSPITAL_BASED_OUTPATIENT_CLINIC_OR_DEPARTMENT_OTHER): Payer: Self-pay

## 2022-05-29 ENCOUNTER — Ambulatory Visit (HOSPITAL_BASED_OUTPATIENT_CLINIC_OR_DEPARTMENT_OTHER): Payer: Federal, State, Local not specified - PPO | Admitting: Internal Medicine

## 2022-05-29 VITALS — BP 126/90 | HR 110 | Ht 64.0 in | Wt 157.4 lb

## 2022-05-29 DIAGNOSIS — E785 Hyperlipidemia, unspecified: Secondary | ICD-10-CM

## 2022-05-29 NOTE — Progress Notes (Signed)
LIPID CLINIC CONSULT NOTE  Chief Complaint:  Manage dyslipidemia  Primary Care Physician: Theresa Monte, NP  Primary Cardiologist:  None  HPI:  Theresa Gonzales is a 57 y.o. female who is being seen today for the evaluation of dyslipidemia at the request of Theresa Ferretti, DO. This is a pleasant 57 yo female kindly referred for evaluation management of dyslipidemia.  She reports longstanding history of high cholesterol and unfortunately intolerance to a number of medications including statins in the past.  She was noted recently to have at least moderate to severe aortic stenosis with preserved LV function on echo.  Subsequently she sought out care at [facility] and is scheduled for left and right heart catheterization in 2 days from now.  There is family history of heart disease including her father who had MI at age 51.  Her most recent lipids were poorly controlled with total cholesterol 337, triglycerides 595 (down from 802), HDL 44 and LDL 174.  She also has a reported history of stroke in the past and her target LDL is less than 70.  She has previously tried Lipitor, Crestor, fenofibrate, Lovaza and other therapies over the years.  All of which caused side effects primarily myalgias.  PMHx:  Past Medical History:  Diagnosis Date   Anemia    Asthma    Bilateral ovarian cysts    CHF (congestive heart failure) (HCC)    Chronic appendicitis    Chronic migraine without aura    Colon polyps    Diverticulosis    Essential hypertension    Factor V Leiden mutation (HCC)    Gallstones    Hepatic steatosis 12/2019   CT   IBS (irritable bowel syndrome)    Kidney stones    Mixed hyperlipidemia    Obstructive sleep apnea of adult    CPAP   Prinzmetal angina (HCC)    Psoriasis    Skin cancer    Stroke (HCC)    Type II diabetes mellitus (HCC)     Past Surgical History:  Procedure Laterality Date   CHOLECYSTECTOMY     OOPHORECTOMY Left    TONSILLECTOMY     TUBAL LIGATION   1991    FAMHx:  Family History  Problem Relation Age of Onset   Hypertension Mother    Alzheimer's disease Mother    Colon polyps Mother    Factor V Leiden deficiency Mother    Deep vein thrombosis Mother    Heart disease Mother    Hypertension Father    Heart disease Father    Multiple sclerosis Sister    Alzheimer's disease Maternal Grandmother    Colon polyps Brother    Diabetes Brother    Liver disease Maternal Grandfather    Liver disease Paternal Grandfather    Liver disease Paternal Uncle    Colon polyps Sister    Factor V Leiden deficiency Sister    Celiac disease Niece    Ulcerative colitis Niece    Irritable bowel syndrome Niece    Colon cancer Neg Hx    Pancreatic cancer Neg Hx    Esophageal cancer Neg Hx     SOCHx:   reports that she has never smoked. She has never used smokeless tobacco. She reports current alcohol use. She reports that she does not use drugs.  ALLERGIES:  Allergies  Allergen Reactions   Penicillins Anaphylaxis and Other (See Comments)   Beef Allergy    Codeine Other (See Comments)    Spasms  Fenofibrate    Gluten Meal Other (See Comments)   Lactose Intolerance (Gi)    Medroxyprogesterone     Other Reaction(s): other   Meperidine Hcl    Statins     Crestor, Lipitor   Sulfa Antibiotics Other (See Comments)    Cramping and bloating    Tilactase     Other Reaction(s): GI Intolerance, Not available    ROS: Pertinent items noted in HPI and remainder of comprehensive ROS otherwise negative.  HOME MEDS: Current Outpatient Medications on File Prior to Visit  Medication Sig Dispense Refill   COLLAGEN PO Take 1 tablet by mouth daily.     EPINEPHrine 0.3 mg/0.3 mL IJ SOAJ injection as needed.     Ferric Maltol (ACCRUFER) 30 MG CAPS Take 30 mg by mouth daily.     hydrochlorothiazide (HYDRODIURIL) 25 MG tablet Take 25 mg by mouth daily.     liothyronine (CYTOMEL) 5 MCG tablet Take 5 mcg by mouth daily.     meloxicam (MOBIC) 15 MG  tablet TAKE 1 TABLET(15 MG) BY MOUTH DAILY (Patient taking differently: Take 15 mg by mouth daily. Neck pain) 90 tablet 0   Multiple Vitamin (MULTIVITAMIN WITH MINERALS) TABS tablet Take 1 tablet by mouth daily.     NON FORMULARY Take 1 capsule by mouth as needed (constipation).     polyethylene glycol (MIRALAX / GLYCOLAX) 17 g packet Take 17 g by mouth daily.     Zinc 30 MG CAPS Take 30 mg by mouth daily.     No current facility-administered medications on file prior to visit.    LABS/IMAGING: No results found for this or any previous visit (from the past 48 hour(s)). No results found.  LIPID PANEL: No results found for: "CHOL", "TRIG", "HDL", "CHOLHDL", "VLDL", "LDLCALC", "LDLDIRECT"  WEIGHTS: Wt Readings from Last 3 Encounters:  05/29/22 157 lb 6.4 oz (71.4 kg)  02/20/21 167 lb 8.8 oz (76 kg)  01/04/21 168 lb (76.2 kg)    VITALS: BP (!) 126/90 (BP Location: Left Arm, Patient Position: Sitting, Cuff Size: Normal)   Pulse (!) 110   Ht 5\' 4"  (1.626 m)   Wt 157 lb 6.4 oz (71.4 kg)   SpO2 95%   BMI 27.02 kg/m   EXAM: General appearance: alert and no distress Lungs: clear to auscultation bilaterally Heart: regular rate and rhythm, S1, S2 normal, systolic murmur: systolic ejection 3/6, crescendo at 2nd right intercostal space, and mid peaking Neurologic: Grossly normal  EKG: Deferred  ASSESSMENT: 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   Theresa Gonzales has possible FCHL, given high trigs and LDL - I would not be surprised if she has an elevated LP(a) as well, as this has been shown to be associated with the risk of aortic stenosis. She is scheduled to have a cath at [facility] on Wednesday. Will follow-up on this. Will check LP(a) and work to try and get her on PCSK9i therapy.   Thanks for the kind referral. Follow-up with me in 3-4 months with repeat  lipids.  Theresa Nose, MD, [facility], FACP  [place]  [facility]  Medical Director of the Advanced Lipid Disorders &  Cardiovascular Risk Reduction Clinic Diplomate of the American Board of Clinical Lipidology Attending Cardiologist  Direct Dial: [phone]  Fax: [phone]  Website:  [email or web address] Nicely Wilkes Potvin 05/29/2022, 4:40 PM

## 2022-05-29 NOTE — Patient Instructions (Signed)
Medication Instructions:  Dr. Rennis Golden recommends Repatha or Praluent (PCSK9). This is an injectable cholesterol medication self-administered once every 14 days. This medication will likely need prior approval with your insurance company, which we will work on. If the medication is not approved initially, we may need to do an appeal with your insurance.   Administer medication in area of fatty tissue such as abdomen, outer thigh, back of upper arm - and rotate site with each injection Store medication in refrigerator until ready to administer - allow to sit at room temp for 30 mins - 1 hour prior to injection Dispose of medication in a SHARPS container - your pharmacy should be able to direct you on this and proper disposal   If you need a co-pay card for Repatha: Lawsponsor.fr If you need a co-pay card for Praluent: [email or web address]  Patient Assistance:    These foundations have funds at various times.   The PAN Foundation: https://www.panfoundation.org/disease-funds/hypercholesterolemia/ -- can sign up for wait list  The [facility] offers assistance to help pay for medication copays.  They will cover copays for all cholesterol lowering meds, including statins, fibrates, omega-3 fish oils like Vascepa, ezetimibe, Repatha, Praluent, Nexletol, Nexlizet.  The cards are usually good for $2,500 or 12 months, whichever comes first. Our fax # is [phone] (you will need this to apply) Go to healthwellfoundation.org Click on "Apply Now" Answer questions as to whom is applying (patient or representative) Your disease fund will be "hypercholesterolemia - Medicare access" They will ask questions about finances and which medications you are taking for cholesterol When you submit, the approval is usually within minutes.  You will need to print the card information from the site You will need to show this information to your pharmacy, they will  bill your Medicare Part D plan first -then bill Health Well --for the copay.   You can also call them at [phone], although the hold times can be quite long.      *If you need a refill on your cardiac medications before your next appointment, please call your pharmacy*   Lab Work: NON-FASTING [facility]   FASTING lab work before next appointment  If you have labs (blood work) drawn today and your tests are completely normal, you will receive your results only by: MyChart Message (if you have MyChart) OR A paper copy in the mail If you have any lab test that is abnormal or we need to change your treatment, we will call you to review the results.    Follow-Up: At [facility], you and your health needs are our priority.  As part of our continuing mission to provide you with exceptional heart care, we have created designated Provider Care Teams.  These Care Teams include your primary Cardiologist (physician) and Advanced Practice Providers (APPs -  Physician Assistants and Nurse Practitioners) who all work together to provide you with the care you need, when you need it.  We recommend signing up for the patient portal called "MyChart".  Sign up information is provided on this After Visit Summary.  MyChart is used to connect with patients for Virtual Visits (Telemedicine).  Patients are able to view lab/test results, encounter notes, upcoming appointments, etc.  Non-urgent messages can be sent to your provider as well.   To learn more about what you can do with MyChart, go to [email or web address].    Your next appointment:    4 months with Dr. Rennis Golden

## 2022-05-31 ENCOUNTER — Encounter (HOSPITAL_BASED_OUTPATIENT_CLINIC_OR_DEPARTMENT_OTHER): Payer: Self-pay | Admitting: Internal Medicine

## 2022-05-31 LAB — LIPOPROTEIN A (LPA): Lipoprotein (a): 262.5 nmol/L — ABNORMAL HIGH (ref <75.0)

## 2022-06-01 ENCOUNTER — Telehealth: Payer: Self-pay | Admitting: Internal Medicine

## 2022-06-01 NOTE — Telephone Encounter (Signed)
PA for Repatha [employer] submitted via CMM (Key: [id number])

## 2022-06-05 NOTE — Telephone Encounter (Signed)
Called [employer] FEP at [phone] to check on status of PA. It is still under review.

## 2022-06-07 NOTE — Telephone Encounter (Signed)
Received denial from [employer] FEP. Denial was for not having LDL documented within the last 6 months, although it was in MD note (no date of labs) and was submitted with PA request. Located lab results under media, dated 03/13/22.

## 2022-06-08 IMAGING — CT CT ABD-PELV W/O CM
2 of 4 series · 16 of 46 positions shown, 18 images · non-contrast
Comparison: 10/21/2013 CT scan from [facility].

CLINICAL DATA: Left flank and side pain.



[Series 2: axial st · axial · 0.82mm/px · z∈[+753,+1188]mm · 13 of 99 slices shown, 15 images]
[im 6/99  soft-tissue]
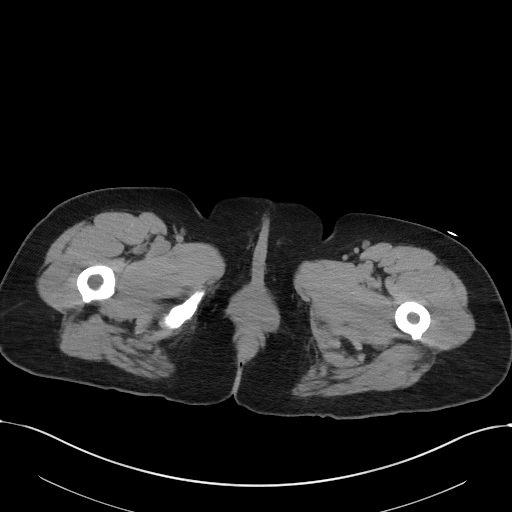
[im 6/99  bone]
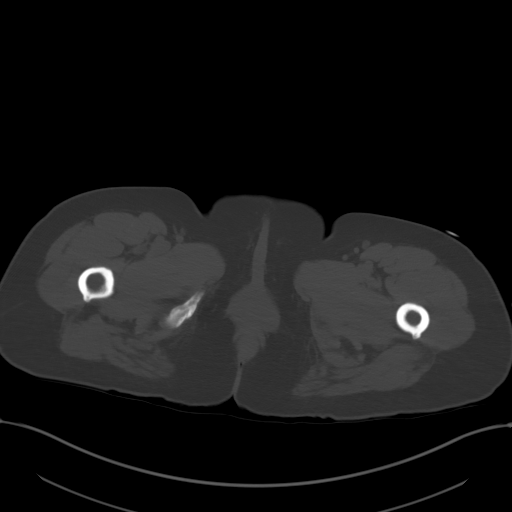
[im 11/99  soft-tissue]
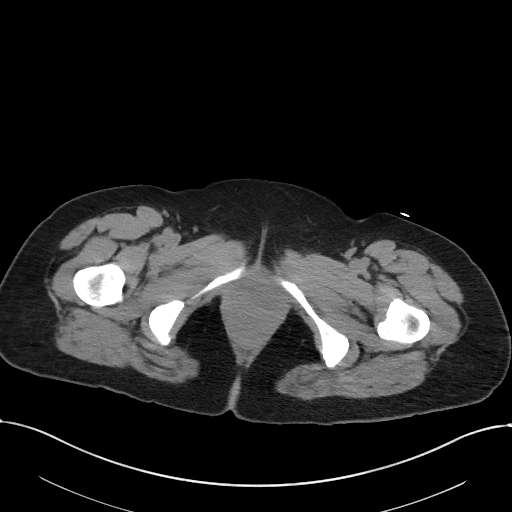
[im 22/99  soft-tissue]
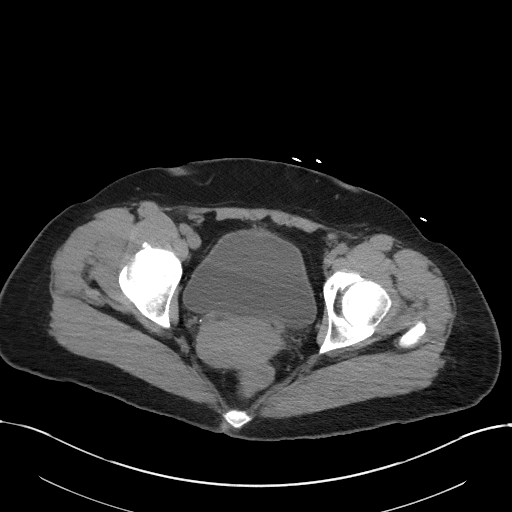
[im 28/99  soft-tissue]
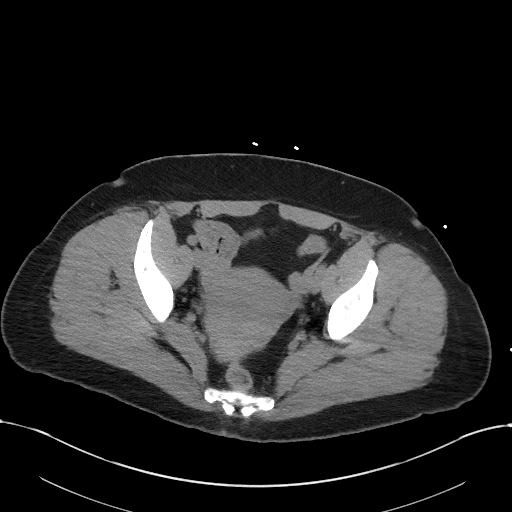
[im 33/99  soft-tissue]
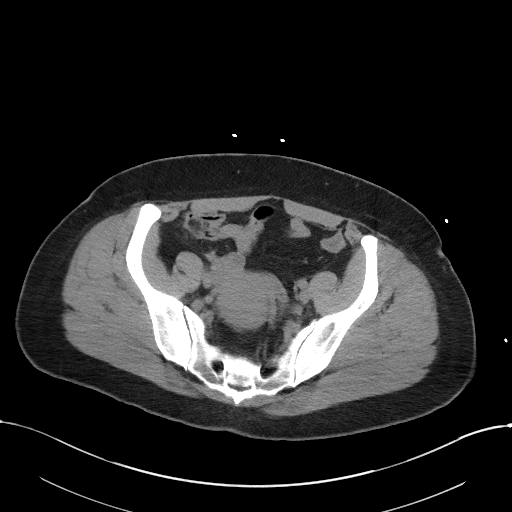
[im 44/99  soft-tissue]
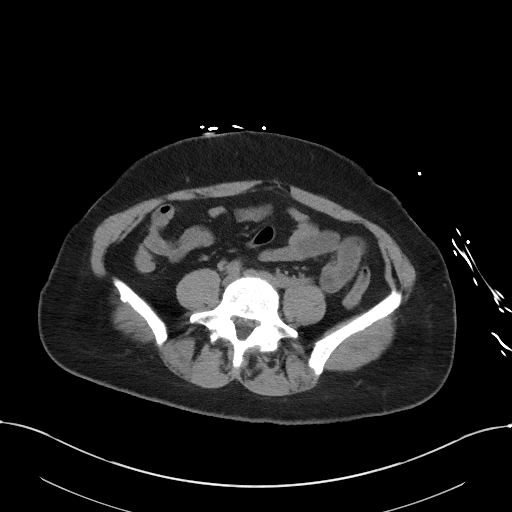
[im 50/99  soft-tissue]
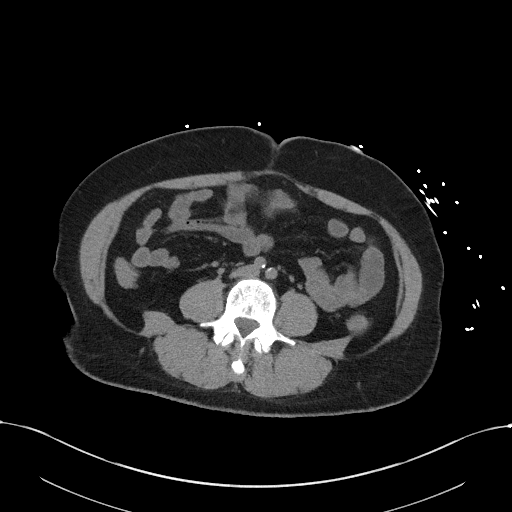
[im 55/99  soft-tissue]
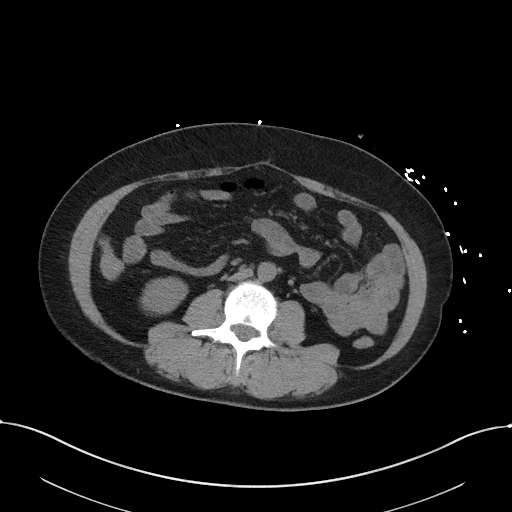
[im 66/99  soft-tissue]
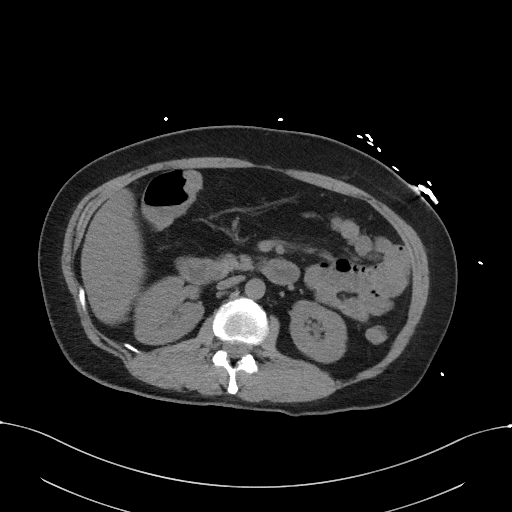
[im 66/99  bone]
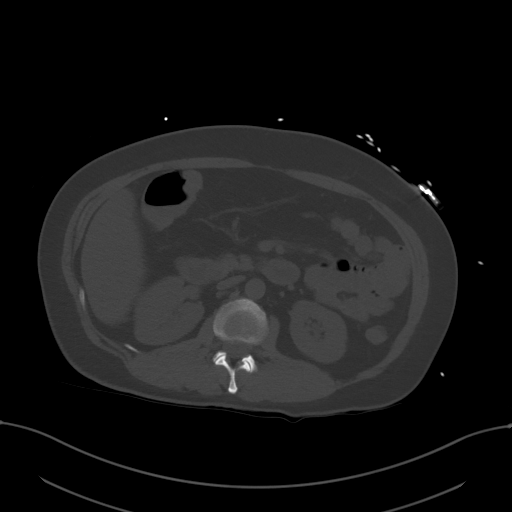
[im 71/99  soft-tissue]
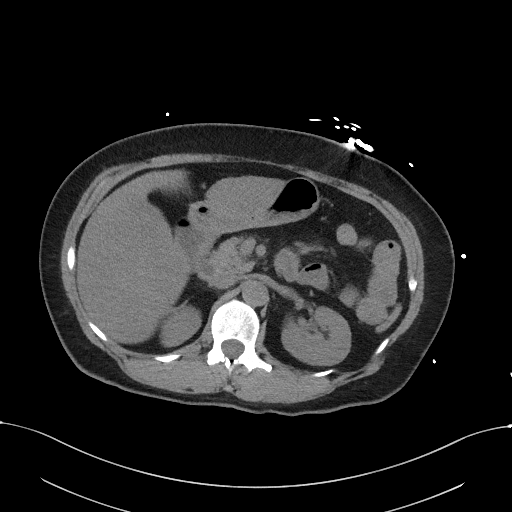
[im 77/99  soft-tissue]
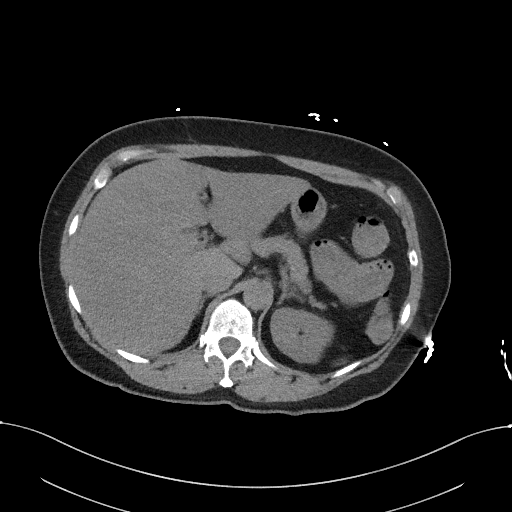
[im 88/99  soft-tissue]
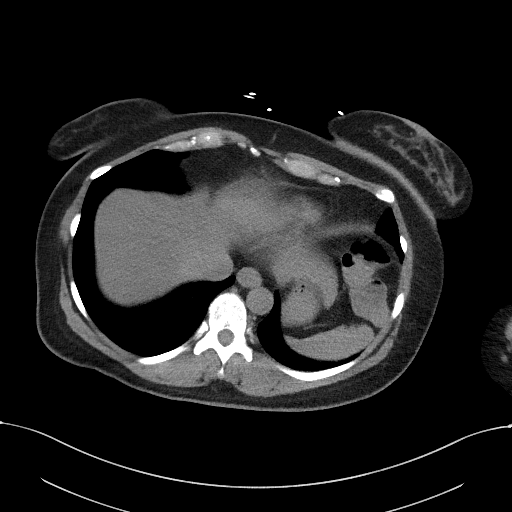
[im 93/99  soft-tissue]
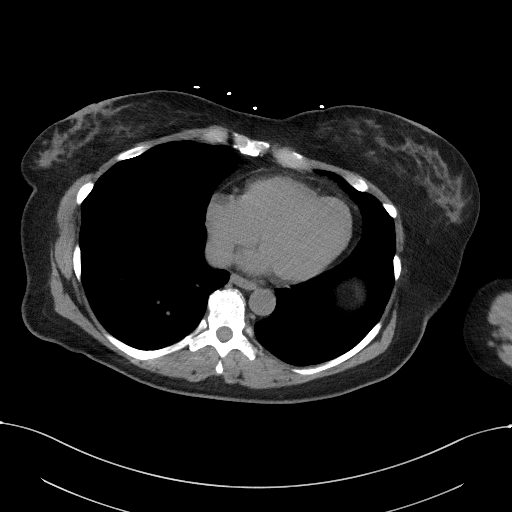

[Series 5: coronal st · coronal · 0.93mm/px · 3 of 111 slices shown]
[im 49/111  soft-tissue]
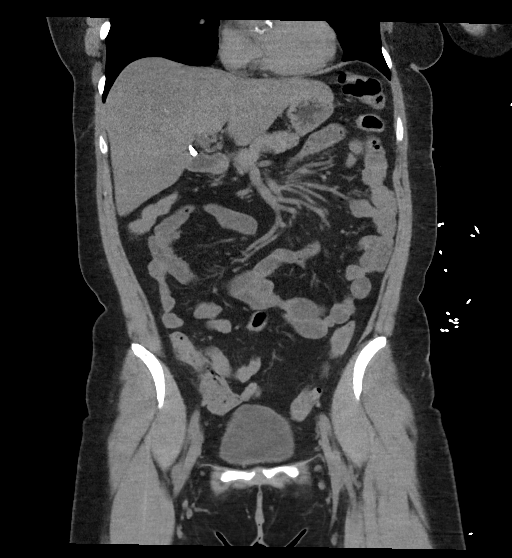
[im 62/111  soft-tissue]
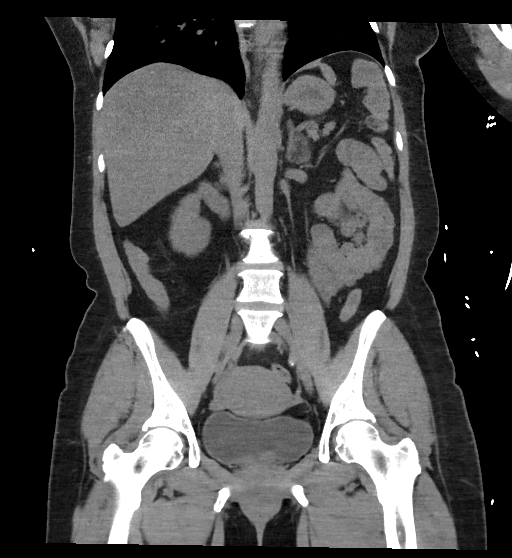
[im 74/111  soft-tissue]
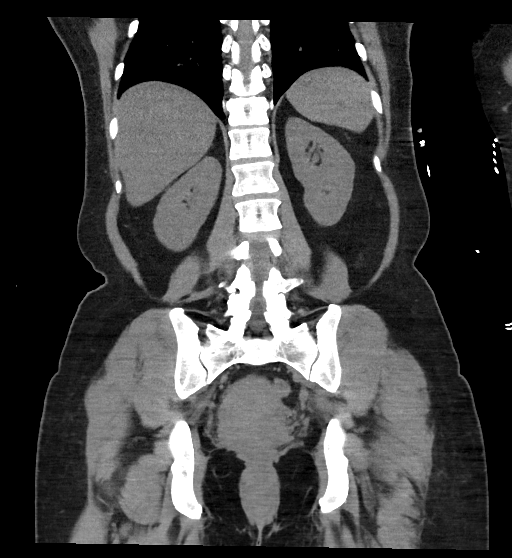

[16 of 46 positions shown; findings below may reference images not displayed]

FINDINGS: Lower chest: 4 mm peripheral right lower lobe nodule on [DATE] is
stable since prior study consistent with benign etiology. Lower
chest otherwise unremarkable.

Hepatobiliary: The liver shows diffusely decreased attenuation
suggesting fat deposition. No focal abnormality in the liver on this
study without intravenous contrast. Gallbladder is surgically
absent. No intrahepatic or extrahepatic biliary dilation.

Pancreas: No focal mass lesion. No dilatation of the main duct. No
intraparenchymal cyst. No peripancreatic edema.

Spleen: No splenomegaly. No focal mass lesion.

Adrenals/Urinary Tract: No adrenal nodule or mass. Kidneys
unremarkable. Specifically, no evidence for renal or ureteral
stones. No secondary changes in either kidney ureter no bladder
stones.

Stomach/Bowel: Stomach is unremarkable. No gastric wall thickening.
No evidence of outlet obstruction. Duodenum is normally positioned
as is the ligament of Treitz. No small bowel wall thickening. No
small bowel dilatation. The terminal ileum is normal. The appendix
is not well visualized, but there is no edema or inflammation in the
region of the cecum. No gross colonic mass. No colonic wall
thickening.

Vascular/Lymphatic: There is mild atherosclerotic calcification of
the abdominal aorta without aneurysm. There is no gastrohepatic or
hepatoduodenal ligament lymphadenopathy. No retroperitoneal or
mesenteric lymphadenopathy. No pelvic sidewall lymphadenopathy.

Reproductive: Unremarkable.

Other: No intraperitoneal free fluid.

Musculoskeletal: No worrisome lytic or sclerotic osseous
abnormality.
IMPRESSION: 1. No acute findings in the abdomen or pelvis. Specifically, no
evidence for urinary stone disease. No other findings to explain the
patient's history of left side and flank pain.
2. Hepatic steatosis.
3. Aortic Atherosclerosis ([id number]).

## 2022-06-09 ENCOUNTER — Telehealth: Payer: Self-pay | Admitting: Internal Medicine

## 2022-06-09 NOTE — Telephone Encounter (Signed)
Pt c/o medication issue:  1. Name of Medication: Repatha   2. How are you currently taking this medication (dosage and times per day)? Not currently taking   3. Are you having a reaction (difficulty breathing--STAT)? No   4. What is your medication issue? Patient is requesting someone else within the office assist her with PA for this medication due to [place] being out of office until 05/21.   Please advise.

## 2022-06-09 NOTE — Telephone Encounter (Signed)
Patietn frustrated regarding PA.  She cannot load the paperwork she is trying to send.  She states [employer] will call to get a fax number to send this paperwork.  She is not sure which office [employer] will reach out to. Gave patient the fax number in case they reach back out to her , to try to help save time

## 2022-06-09 NOTE — Telephone Encounter (Signed)
Spoke with patient and we did receive her membership reconsideration letter. However unable to find labs with LDL level within 6 months. Patient states she did have labs drawn in February 2024 and she will send to [place] via mychart. Once we receive lab work we can send information to insurance.

## 2022-06-09 NOTE — Telephone Encounter (Signed)
Spoke with patient and she is having labs faxed over to our office. Nurse Terri gave her the fax number.

## 2022-06-12 ENCOUNTER — Telehealth: Payer: Self-pay | Admitting: Pharmacist

## 2022-06-12 ENCOUNTER — Other Ambulatory Visit (HOSPITAL_COMMUNITY): Payer: Self-pay

## 2022-06-12 NOTE — Telephone Encounter (Signed)
Hi Megan,                        I have submitted the updated labs of the patients LDLC Labs, and marked as urgent. I will update you with the Outcome.

## 2022-06-12 NOTE — Telephone Encounter (Signed)
Received message from Thayer Ohm, Consulting civil engineer to help with Repath appeals. Eileen Stanford had been working on this and is out of the office until 5/21. Initial PA denied because labs were needed within 6 months. These were sent originally but the date was not on them. Labs scanned in Media tab from 03/13/22:  Per Jenna's 5/2 note, pt's insurance also mandated that pt submit a: ""member reconsideration letter" which is a requirement of your insurance. The letter would need to come from you and list your name, DOB, insurance ID, demographics and say something along the lines of: My doctor has requested I take Repatha." This letter is in the 5/2 phone note as well and copied below:   I cannot see the fax # to send appeal request to and Eileen Stanford is off for the next week. Her note mentions the [facility] FEP phone # which is [phone].  Pharm tech team - are you able to call to see if this info can be submitted over the phone as an appeals? And if it needs to be faxed, are you able to get their fax # and send over the info noted in this phone note along with Dr Blanchie Dessert last office visit? Thank you!

## 2022-06-12 NOTE — Telephone Encounter (Signed)
Documenting that pt has called back to f/u on this request. She wants to ensure that Thayer Ohm received her email and someone is working her request. Informed her I sent a message to [place] through teams will have him or nurse c/b with an update.

## 2022-06-16 ENCOUNTER — Encounter: Payer: Self-pay | Admitting: Pharmacist

## 2022-06-16 NOTE — Telephone Encounter (Signed)
Patient signed representative form. I have faxed appeals letter and documents. Received another fax saying the needed documentation. Documentation including chart notes, labs and the test results pt sent via mychart were faxed again.

## 2022-06-19 ENCOUNTER — Other Ambulatory Visit (HOSPITAL_COMMUNITY): Payer: Self-pay

## 2022-06-19 MED ORDER — REPATHA SURECLICK 140 MG/ML ~~LOC~~ SOAJ: 1.0000 mL | SUBCUTANEOUS | 11 refills | Status: DC

## 2022-06-19 NOTE — Telephone Encounter (Signed)
Mychart message sent to patient.

## 2022-06-19 NOTE — Telephone Encounter (Signed)
I thought they were strange also. Per test claim expiration is correct.     That's what the plan sent on fax.

## 2022-06-19 NOTE — Telephone Encounter (Addendum)
Pharmacy Patient Advocate Encounter  Prior Authorization for REPATHA has been approved.    Effective dates: 05/17/22 through 06/16/23   Haze Rushing, CPhT Pharmacy Patient Advocate Specialist Direct Number: [phone] Fax: [phone]

## 2022-06-19 NOTE — Telephone Encounter (Signed)
I see the confusion now. Let me fix it

## 2022-06-20 NOTE — Telephone Encounter (Signed)
Per other encounters, patient is now approved

## 2022-07-04 ENCOUNTER — Institutional Professional Consult (permissible substitution) (HOSPITAL_BASED_OUTPATIENT_CLINIC_OR_DEPARTMENT_OTHER): Payer: Federal, State, Local not specified - PPO | Admitting: Internal Medicine

## 2022-07-05 ENCOUNTER — Telehealth: Payer: Self-pay | Admitting: Internal Medicine

## 2022-07-05 MED ORDER — METOPROLOL SUCCINATE ER 25 MG PO TB24: 12.5000 mg | ORAL_TABLET | Freq: Every day | ORAL | Status: DC

## 2022-07-05 NOTE — Telephone Encounter (Signed)
Spoke with pt states that she had a cath after her appt with Dr Rennis Golden, she was started  taking metoprolol 25mg  at that time and it was making her feel "woozy" so she cut her dose in half 12.5mg  and this "did not do anything" she continued taking 12.5mg  but she is still feeling "woozy". She states that her diastolic is 90. Informed pt that our BP systolic BP number is <90, verbalized understanding. She wanted to schedule appt with Dr Rennis Golden only. She has an appt scheduled at the end of August. First available appt is not until October. Informed pt. She states that she will wait "since 90 is normal". She is not mentioning the diastolic. She will wait until her scheduled appointment in August. She does not want "an appointment with PA".

## 2022-07-05 NOTE — Telephone Encounter (Signed)
Pt c/o medication issue:  1. Name of Medication: Metoprolol  2. How are you currently taking this medication (dosage and times per day)?   3. Are you having a reaction (difficulty breathing--STAT)?   4. What is your medication issue?   Patient states she was recently put on this medication and she is not tolerating it well. She also mentions that she feels exhausted.

## 2022-08-30 ENCOUNTER — Other Ambulatory Visit: Payer: Self-pay | Admitting: Internal Medicine

## 2022-08-30 DIAGNOSIS — E785 Hyperlipidemia, unspecified: Secondary | ICD-10-CM

## 2022-08-30 DIAGNOSIS — E7841 Elevated Lipoprotein(a): Secondary | ICD-10-CM

## 2022-09-04 ENCOUNTER — Ambulatory Visit: Payer: Federal, State, Local not specified - PPO | Attending: Internal Medicine | Admitting: Internal Medicine

## 2022-09-04 ENCOUNTER — Encounter: Payer: Self-pay | Admitting: Internal Medicine

## 2022-09-04 VITALS — BP 126/72 | HR 73 | Ht 64.0 in | Wt 161.0 lb

## 2022-09-04 DIAGNOSIS — E7841 Elevated Lipoprotein(a): Secondary | ICD-10-CM

## 2022-09-04 DIAGNOSIS — I350 Nonrheumatic aortic (valve) stenosis: Secondary | ICD-10-CM | POA: Diagnosis not present

## 2022-09-04 DIAGNOSIS — E785 Hyperlipidemia, unspecified: Secondary | ICD-10-CM

## 2022-09-04 DIAGNOSIS — I2510 Atherosclerotic heart disease of native coronary artery without angina pectoris: Secondary | ICD-10-CM

## 2022-09-04 DIAGNOSIS — E781 Pure hyperglyceridemia: Secondary | ICD-10-CM

## 2022-09-04 MED ORDER — REPATHA SURECLICK 140 MG/ML ~~LOC~~ SOAJ: 1.0000 mL | SUBCUTANEOUS | 3 refills | Status: DC

## 2022-09-04 MED ORDER — ICOSAPENT ETHYL 1 G PO CAPS: 2.0000 g | ORAL_CAPSULE | Freq: Two times a day (BID) | ORAL | 3 refills | Status: DC

## 2022-09-04 NOTE — Patient Instructions (Signed)
Medication Instructions:  STOP Metoprolol START Vascepa 2 g 2 times a day *If you need a refill on your cardiac medications before your next appointment, please call your pharmacy*   Lab Work: NMR in 3 weeks fasting NMR and 1 week prior to appointment with Dr. Rennis Golden If you have labs (blood work) drawn today and your tests are completely normal, you will receive your results only by: MyChart Message (if you have MyChart) OR A paper copy in the mail If you have any lab test that is abnormal or we need to change your treatment, we will call you to review the results.   Testing/Procedures: No testing   Follow-Up: At [facility], you and your health needs are our priority.  As part of our continuing mission to provide you with exceptional heart care, we have created designated Provider Care Teams.  These Care Teams include your primary Cardiologist (physician) and Advanced Practice Providers (APPs -  Physician Assistants and Nurse Practitioners) who all work together to provide you with the care you need, when you need it.  Your next appointment:   3-4 month(s)  Provider:   Chrystie Nose, MD

## 2022-09-04 NOTE — Progress Notes (Signed)
LIPID CLINIC CONSULT NOTE  Chief Complaint:  Follow-up dyslipidemia  Primary Care Physician: Verlee Monte, NP  Primary Cardiologist:  None  HPI:  Theresa Gonzales is a 57 y.o. female who is being seen today for the evaluation of dyslipidemia at the request of [name], Lenny Pastel, NP. This is a pleasant 57 yo female kindly referred for evaluation management of dyslipidemia.  She reports longstanding history of high cholesterol and unfortunately intolerance to a number of medications including statins in the past.  She was noted recently to have at least moderate to severe aortic stenosis with preserved LV function on echo.  Subsequently she sought out care at [facility] and is scheduled for left and right heart catheterization in 2 days from now.  There is family history of heart disease including her father who had MI at age 61.  Her most recent lipids were poorly controlled with total cholesterol 337, triglycerides 595 (down from 802), HDL 44 and LDL 174.  She also has a reported history of stroke in the past and her target LDL is less than 70.  She has previously tried Lipitor, Crestor, fenofibrate, Lovaza and other therapies over the years.  All of which caused side effects primarily myalgias.  09/04/2022  Ms. Millirons returns today for follow-up.  She has been on Repatha.  She seems to be tolerating this well.  She had labs through her PCP back on July 9.  This showed total cholesterol 240, triglycerides 527, HDL 45 and LDL could not be calculated.  We did also order an NMR and LP(a) however this was drawn just past Friday, 8/2 and the labs have not yet been processed.  She tells me she enrolled in an LP(a) study since we did not have a study available.  I would not be blinded to her lipids however she should not share that with the principal investigator.  As her triglycerides remain elevated we discussed today about other possible therapeutic options including Vascepa.  This is based on the 2019  REDUCE-IT trial data showing about a 30% relative risk reduction in cardiovascular disease.  She was found to have coronary artery disease including two-vessel disease that was not amenable to intervention and managed medically.  She was placed on a beta-blocker although reports significant fatigue with this.  She is cut it back but is interested in stopping it.  PMHx:  Past Medical History:  Diagnosis Date   Anemia    Asthma    Bilateral ovarian cysts    CHF (congestive heart failure) (HCC)    Chronic appendicitis    Chronic migraine without aura    Colon polyps    Diverticulosis    Essential hypertension    Factor V Leiden mutation (HCC)    Gallstones    Hepatic steatosis 12/2019   CT   IBS (irritable bowel syndrome)    Kidney stones    Mixed hyperlipidemia    Obstructive sleep apnea of adult    CPAP   Prinzmetal angina (HCC)    Psoriasis    Skin cancer    Stroke (HCC)    Type II diabetes mellitus (HCC)     Past Surgical History:  Procedure Laterality Date   CHOLECYSTECTOMY     OOPHORECTOMY Left    TONSILLECTOMY     TUBAL LIGATION  1991    FAMHx:  Family History  Problem Relation Age of Onset   Hypertension Mother    Alzheimer's disease Mother    Colon polyps  Mother    Factor V Leiden deficiency Mother    Deep vein thrombosis Mother    Heart disease Mother    Hypertension Father    Heart disease Father    Multiple sclerosis Sister    Alzheimer's disease Maternal Grandmother    Colon polyps Brother    Diabetes Brother    Liver disease Maternal Grandfather    Liver disease Paternal Grandfather    Liver disease Paternal Uncle    Colon polyps Sister    Factor V Leiden deficiency Sister    Celiac disease Niece    Ulcerative colitis Niece    Irritable bowel syndrome Niece    Colon cancer Neg Hx    Pancreatic cancer Neg Hx    Esophageal cancer Neg Hx     SOCHx:   reports that she has never smoked. She has never used smokeless tobacco. She reports current  alcohol use. She reports that she does not use drugs.  ALLERGIES:  Allergies  Allergen Reactions   Penicillins Anaphylaxis and Other (See Comments)   Beef Allergy    Codeine Other (See Comments)    Spasms    Gluten Meal Other (See Comments)   Lactose Intolerance (Gi)    Medroxyprogesterone     Other Reaction(s): other   Meperidine Hcl    Statins     Crestor, Lipitor   Sulfa Antibiotics Other (See Comments)    Cramping and bloating    Tilactase     Other Reaction(s): GI Intolerance, Not available DAIRY    ROS: Pertinent items noted in HPI and remainder of comprehensive ROS otherwise negative.  HOME MEDS: Current Outpatient Medications on File Prior to Visit  Medication Sig Dispense Refill   albuterol (VENTOLIN HFA) 108 (90 Base) MCG/ACT inhaler Inhale 2 puffs into the lungs every 4 (four) hours as needed.     EPINEPHrine 0.3 mg/0.3 mL IJ SOAJ injection as needed.     Evolocumab (REPATHA SURECLICK) 140 MG/ML SOAJ Inject 140 mg into the skin every 14 (fourteen) days. 2 mL 11   fluocinonide (LIDEX) 0.05 % external solution Apply 1 Application topically 2 (two) times daily.     liothyronine (CYTOMEL) 5 MCG tablet Take 5 mcg by mouth daily.     meloxicam (MOBIC) 15 MG tablet TAKE 1 TABLET(15 MG) BY MOUTH DAILY (Patient taking differently: Take 15 mg by mouth daily. Neck pain) 90 tablet 0   metoprolol succinate (TOPROL-XL) 25 MG 24 hr tablet Take 0.5 tablets (12.5 mg total) by mouth daily. Take with or immediately following a meal.     Multiple Vitamin (MULTIVITAMIN WITH MINERALS) TABS tablet Take 1 tablet by mouth daily.     nitroGLYCERIN (NITROSTAT) 0.4 MG SL tablet Place 0.4 mg under the tongue every 5 (five) minutes as needed for chest pain.     NON FORMULARY Take 1 capsule by mouth as needed (constipation).     polyethylene glycol (MIRALAX / GLYCOLAX) 17 g packet Take 17 g by mouth daily.     sucralfate (CARAFATE) 1 g tablet Take 1 g by mouth 2 (two) times daily as needed.      valACYclovir (VALTREX) 500 MG tablet Take 500 mg by mouth daily as needed.     COLLAGEN PO Take 1 tablet by mouth daily. (Patient not taking: Reported on 09/04/2022)     Ferric Maltol (ACCRUFER) 30 MG CAPS Take 30 mg by mouth daily. (Patient not taking: Reported on 09/04/2022)     Ferrous Gluconate 324 (37.5 Fe) MG  TABS Take 324 mg by mouth daily. (Patient not taking: Reported on 09/04/2022)     hydrochlorothiazide (HYDRODIURIL) 25 MG tablet Take 25 mg by mouth daily.     Zinc 30 MG CAPS Take 30 mg by mouth daily. (Patient not taking: Reported on 09/04/2022)     No current facility-administered medications on file prior to visit.    LABS/IMAGING: No results found for this or any previous visit (from the past 48 hour(s)). No results found.  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   Mrs. Breitenstein has had some reduction in her lipids with total cholesterol now 240 down from 338.  Her LDL was not calculated however we ordered repeat labs today are pending.  Would recommend remaining on the Repatha.  She is a good candidate to add Vascepa based on her elevated triglycerides and cardiovascular risk reduction associated with that.  Will start 2 g twice daily.  She should  have repeat lipids through [facility] about 1 week or more prior to her follow-up visit which will be in about 3 to 4 months.  As mentioned she is enrolled in an LP(a) research trial.  Chrystie Nose, MD, [facility], FACP  [place]  [facility]  Medical Director of the Advanced Lipid Disorders &  Cardiovascular Risk Reduction Clinic Diplomate of the American Board of Clinical Lipidology Attending Cardiologist  Direct Dial: [phone]  Fax: [phone]  Website:  [email or web address] Nicely Margee Trentham 09/04/2022, 9:26 AM

## 2022-09-08 ENCOUNTER — Other Ambulatory Visit (HOSPITAL_COMMUNITY): Payer: Self-pay

## 2022-09-25 ENCOUNTER — Telehealth: Payer: Self-pay | Admitting: Neurology

## 2022-09-25 NOTE — Telephone Encounter (Signed)
MRI of the brain from [facility] on September 19, 2022, mild to moderate generalized atrophy with moderate atrophy of cerebellar vermis,  Please advise patient to bring MRI CD to review at her initial appointment with our office

## 2022-09-27 ENCOUNTER — Ambulatory Visit (INDEPENDENT_AMBULATORY_CARE_PROVIDER_SITE_OTHER): Payer: Federal, State, Local not specified - PPO | Admitting: Neurology

## 2022-09-27 ENCOUNTER — Encounter: Payer: Self-pay | Admitting: Neurology

## 2022-09-27 VITALS — BP 128/70 | Ht 64.0 in | Wt 157.0 lb

## 2022-09-27 DIAGNOSIS — R269 Unspecified abnormalities of gait and mobility: Secondary | ICD-10-CM

## 2022-09-27 DIAGNOSIS — R202 Paresthesia of skin: Secondary | ICD-10-CM

## 2022-09-27 NOTE — Progress Notes (Signed)
Chief Complaint  Patient presents with   New Patient (Initial Visit)    Rm 15, NP, Suspected TIA, generalized atrophy,left side more affected      ASSESSMENT AND PLAN  Theresa Gonzales is a 57 y.o. female   Gradual onset slow worsening gait abnormality,  Brisk reflex on examinations,  MRI of the brain showed mild age-related changes, stable looking of upper vermis, do look mildly leafy, but could still be within normal range  Laboratory evaluations,  MRI of lumbar and thoracic spine to rule out structural abnormality, outside MRI of cervical spine showed no significant abnormality.  She has pending up ointment with [facility] in January 2025  DIAGNOSTIC DATA (LABS, IMAGING, TESTING) - I reviewed patient records, labs, notes, testing and imaging myself where available.   MEDICAL HISTORY:  Theresa Gonzales, is a 57 year old female seen in request by her primary care nurse practitioner  Belva Agee for evaluation of unsteady gait, initial evaluation was September 27, 2022 with her husband  I reviewed and summarized the referring note.PMHX/ Hypertension, Hypothyroidism  She noticed gradual onset of mildly unsteady gait in 2018, tripped over her feet, in addition, she noticed a constant dizziness, wooziness sensation, regardless of sitting down or standing up, for that reason, she has almost given up driving,  She complains of unsteady sensation while ambulate, as if she has been drinking, she denies history of alcohol use, she sleeps well, eat healthy  But since April 2024, she had worsening symptoms, feeling tired, intermittent slurred speech, body vibrating sensation,  She had a strong family history of central nervous system degenerative disorder, mother died at 77 from dementia, multiple family members on her mother side suffered dementia, and Parkinson's disease.  one of her sister died of multiple sclerosis  She reported she was involved in an [employer] healthy volunteer study brain  imaging study from 2000-2012, had multiple brain imaging in the past  Today she also brought MRI of brain from 2022 and most recent one from [facility] on September 19, 2022, reported mild to moderate generalized atrophy with moderate atrophy of cerebellar vermis, But compared two imaging with 2 years span showed no significant change  She was previously very active, walked [place] in the past, this has caused a lot of frustration on her   PHYSICAL EXAM:   Vitals:   09/27/22 1611  BP: 128/70  Weight: 157 lb (71.2 kg)  Height: 5\' 4"  (1.626 m)     Body mass index is 26.95 kg/m.  PHYSICAL EXAMNIATION:  Gen: NAD, conversant, well nourised, well groomed                     Cardiovascular: Regular rate rhythm, no peripheral edema, warm, nontender. Eyes: Conjunctivae clear without exudates or hemorrhage Neck: Supple, no carotid bruits. Pulmonary: Clear to auscultation bilaterally   NEUROLOGICAL EXAM:  MENTAL STATUS: Speech/cognition: Frustrated middle-age female, awake, alert, oriented to history taking and casual conversation CRANIAL NERVES: CN II: Visual fields are full to confrontation. Pupils are round equal and briskly reactive to light. CN III, IV, VI: extraocular movement are normal. No ptosis. CN V: Facial sensation is intact to light touch CN VII: Face is symmetric with normal eye closure  CN VIII: Hearing is normal to causal conversation. CN IX, X: Phonation is normal. CN XI: Head turning and shoulder shrug are intact  MOTOR: There is no pronator drift of out-stretched arms. Muscle bulk and tone are normal. Muscle strength is normal.  REFLEXES: Reflexes are 2+ and symmetric at the biceps, triceps, knees, and ankles. Plantar responses are flexor.  SENSORY: Intact to light touch, pinprick and vibratory sensation are intact in fingers and toes.  COORDINATION: There is no trunk or limb dysmetria noted.  GAIT/STANCE: Posture is normal. Gait is cautious  but steady with normal steps, base, arm swing, and turning. Heel and toe walking are normal.  With cautious tandem walking, able to catch her balance, Romberg is absent.  REVIEW OF SYSTEMS:  Full 14 system review of systems performed and notable only for as above All other review of systems were negative.   ALLERGIES: Allergies  Allergen Reactions   Penicillins Anaphylaxis and Other (See Comments)   Beef Allergy    Codeine Other (See Comments)    Spasms    Gluten Meal Other (See Comments)   Lactose Intolerance (Gi)    Medroxyprogesterone     Other Reaction(s): other   Meperidine Hcl    Statins     Crestor, Lipitor   Sulfa Antibiotics Other (See Comments)    Cramping and bloating    Tilactase     Other Reaction(s): GI Intolerance, Not available DAIRY    HOME MEDICATIONS: Current Outpatient Medications  Medication Sig Dispense Refill   albuterol (VENTOLIN HFA) 108 (90 Base) MCG/ACT inhaler Inhale 2 puffs into the lungs every 4 (four) hours as needed.     EPINEPHrine 0.3 mg/0.3 mL IJ SOAJ injection as needed.     Evolocumab (REPATHA SURECLICK) 140 MG/ML SOAJ Inject 140 mg into the skin every 14 (fourteen) days. 6 mL 3   fluocinonide (LIDEX) 0.05 % external solution Apply 1 Application topically 2 (two) times daily.     icosapent Ethyl (VASCEPA) 1 g capsule Take 2 capsules (2 g total) by mouth 2 (two) times daily. 360 capsule 3   liothyronine (CYTOMEL) 5 MCG tablet Take 5 mcg by mouth daily.     Multiple Vitamin (MULTIVITAMIN WITH MINERALS) TABS tablet Take 1 tablet by mouth daily.     nitroGLYCERIN (NITROSTAT) 0.4 MG SL tablet Place 0.4 mg under the tongue every 5 (five) minutes as needed for chest pain.     NON FORMULARY Take 1 capsule by mouth as needed (constipation).     polyethylene glycol (MIRALAX / GLYCOLAX) 17 g packet Take 17 g by mouth daily.     sucralfate (CARAFATE) 1 g tablet Take 1 g by mouth 2 (two) times daily as needed.     valACYclovir (VALTREX) 500 MG  tablet Take 500 mg by mouth daily as needed.     COLLAGEN PO Take 1 tablet by mouth daily. (Patient not taking: Reported on 09/04/2022)     Ferric Maltol (ACCRUFER) 30 MG CAPS Take 30 mg by mouth daily. (Patient not taking: Reported on 09/04/2022)     Ferrous Gluconate 324 (37.5 Fe) MG TABS Take 324 mg by mouth daily. (Patient not taking: Reported on 09/04/2022)     hydrochlorothiazide (HYDRODIURIL) 25 MG tablet Take 25 mg by mouth daily.     meloxicam (MOBIC) 15 MG tablet TAKE 1 TABLET(15 MG) BY MOUTH DAILY (Patient not taking: Reported on 09/27/2022) 90 tablet 0   Zinc 30 MG CAPS Take 30 mg by mouth daily. (Patient not taking: Reported on 09/04/2022)     No current facility-administered medications for this visit.    PAST MEDICAL HISTORY: Past Medical History:  Diagnosis Date   Anemia    Asthma    Bilateral ovarian cysts    CHF (  congestive heart failure) (HCC)    Chronic appendicitis    Chronic migraine without aura    Colon polyps    Diverticulosis    Essential hypertension    Factor V Leiden mutation (HCC)    Gallstones    Hepatic steatosis 12/2019   CT   IBS (irritable bowel syndrome)    Kidney stones    Mixed hyperlipidemia    Obstructive sleep apnea of adult    CPAP   Prinzmetal angina (HCC)    Psoriasis    Skin cancer    Stroke (HCC)     PAST SURGICAL HISTORY: Past Surgical History:  Procedure Laterality Date   CHOLECYSTECTOMY     OOPHORECTOMY Left    TONSILLECTOMY     TUBAL LIGATION  1991    FAMILY HISTORY: Family History  Problem Relation Age of Onset   Hypertension Mother    Alzheimer's disease Mother    Colon polyps Mother    Factor V Leiden deficiency Mother    Deep vein thrombosis Mother    Heart disease Mother    Hypertension Father    Heart disease Father    Multiple sclerosis Sister    Alzheimer's disease Maternal Grandmother    Colon polyps Brother    Diabetes Brother    Liver disease Maternal Grandfather    Liver disease Paternal Grandfather     Liver disease Paternal Uncle    Colon polyps Sister    Factor V Leiden deficiency Sister    Celiac disease Niece    Ulcerative colitis Niece    Irritable bowel syndrome Niece    Colon cancer Neg Hx    Pancreatic cancer Neg Hx    Esophageal cancer Neg Hx     SOCIAL HISTORY: Social History   Socioeconomic History   Marital status: Married    Spouse name: Not on file   Number of children: 2   Years of education: Not on file   Highest education level: Not on file  Occupational History   Occupation: Home maker  Tobacco Use   Smoking status: Never   Smokeless tobacco: Never  Vaping Use   Vaping status: Never Used  Substance and Sexual Activity   Alcohol use: Yes    Comment: rarely-once a year   Drug use: Never   Sexual activity: Not on file  Other Topics Concern   Not on file  Social History Narrative   Right handed   Caffeine-occasionally   married   Social Determinants of Health   Financial Resource Strain: Not on file  Food Insecurity: Low Risk  (05/29/2022)   [facility]   Food vital sign    Within the past 12 months, you worried that your food would run out before you got money to buy more: Never true    Within the past 12 months, the food you bought just didn't last and you didn't have money to get more. : Never true  Transportation Needs: No Transportation Needs (05/29/2022)   [facility]   Transportation    In the past 12 months, has lack of reliable transportation kept you from medical appointments, meetings, work or from getting things needed for daily living? : No  Physical Activity: Not on file  Stress: Not on file  Social Connections: Not on file  Intimate Partner Violence: Not on file      Levert Feinstein, M.D. Ph.D.  [facility] [address], [address] [place], [place]  [address] Ph: [phone] Fax: [phone]  CC:  Verlee Monte, NP [address] [place],   [place] [address]  [name] Lenny Pastel, NP

## 2022-09-28 ENCOUNTER — Other Ambulatory Visit (INDEPENDENT_AMBULATORY_CARE_PROVIDER_SITE_OTHER): Payer: Self-pay

## 2022-09-28 DIAGNOSIS — Z0289 Encounter for other administrative examinations: Secondary | ICD-10-CM

## 2022-09-29 LAB — NMR, LIPOPROFILE: Triglycerides: 221 mg/dL — ABNORMAL HIGH (ref 0–149)

## 2022-10-04 ENCOUNTER — Encounter: Payer: Self-pay | Admitting: Internal Medicine

## 2022-10-04 LAB — MULTIPLE MYELOMA PANEL, SERUM
Albumin SerPl Elph-Mcnc: 4 g/dL (ref 2.9–4.4)
Albumin/Glob SerPl: 1.1 (ref 0.7–1.7)
Alpha 1: 0.3 g/dL (ref 0.0–0.4)
Alpha2 Glob SerPl Elph-Mcnc: 1 g/dL (ref 0.4–1.0)
B-Globulin SerPl Elph-Mcnc: 1.3 g/dL (ref 0.7–1.3)
Gamma Glob SerPl Elph-Mcnc: 1.2 g/dL (ref 0.4–1.8)
Globulin, Total: 3.8 g/dL (ref 2.2–3.9)
IgA/Immunoglobulin A, Serum: 181 mg/dL (ref 87–352)
IgG (Immunoglobin G), Serum: 1101 mg/dL (ref 586–1602)
IgM (Immunoglobulin M), Srm: 120 mg/dL (ref 26–217)
Total Protein: 7.8 g/dL (ref 6.0–8.5)

## 2022-10-04 LAB — AUTOIMMUNE NEUROLOGY AB
AGNA-1: NEGATIVE
AMPA-R1 Antibody: NEGATIVE
AMPA-R2 Antibody: NEGATIVE
Amphiphysin Antibody: NEGATIVE
Anti-Hu Ab: NEGATIVE
Anti-Ri Ab: NEGATIVE
Anti-Yo Ab: NEGATIVE
Antineruonal nuclear Ab Type 3: NEGATIVE
Aquaporin 4 Antibody: NEGATIVE
CASPR2 Antibody,Cell-based IFA: NEGATIVE
CRMP-5 IgG: NEGATIVE
DNER Antibody: NEGATIVE
DPPX Antibody: NEGATIVE
GABA-B-R Antibody: NEGATIVE
GAD65 Antibody: NEGATIVE
ITPR1 Antibody: NEGATIVE
IgLON5 Antibody: NEGATIVE
Interpretation: NEGATIVE
LGI1 Antibody, Cell-based IFA: NEGATIVE
Ma2/Ta Antibody: NEGATIVE
NMDA-R Antibody: NEGATIVE
Purkinje Cell Cyto Ab Type 2: NEGATIVE
Purkinje Cell Cyto Ab Type Tr: NEGATIVE
VGCC Antibody: 11.6 pmol/L (ref 0.0–30.0)
Zic4 Antibody: NEGATIVE
mGluR1 Antibody: NEGATIVE

## 2022-10-04 LAB — CK: Total CK: 280 U/L — ABNORMAL HIGH (ref 32–182)

## 2022-10-04 LAB — SEDIMENTATION RATE: Sed Rate: 20 mm/h (ref 0–40)

## 2022-10-04 LAB — C-REACTIVE PROTEIN: CRP: 4 mg/L (ref 0–10)

## 2022-10-04 LAB — VITAMIN B12: Vitamin B-12: 1148 pg/mL (ref 232–1245)

## 2022-10-04 LAB — TSH: TSH: 2.16 u[IU]/mL (ref 0.450–4.500)

## 2022-10-04 LAB — VITAMIN D 25 HYDROXY (VIT D DEFICIENCY, FRACTURES): Vit D, 25-Hydroxy: 41.9 ng/mL (ref 30.0–100.0)

## 2022-10-04 LAB — RPR: RPR Ser Ql: NONREACTIVE

## 2022-10-04 LAB — ANA W/REFLEX IF POSITIVE: Anti Nuclear Antibody (ANA): NEGATIVE

## 2022-10-10 ENCOUNTER — Ambulatory Visit (INDEPENDENT_AMBULATORY_CARE_PROVIDER_SITE_OTHER): Payer: Federal, State, Local not specified - PPO

## 2022-10-10 DIAGNOSIS — R202 Paresthesia of skin: Secondary | ICD-10-CM | POA: Diagnosis not present

## 2022-10-10 DIAGNOSIS — R269 Unspecified abnormalities of gait and mobility: Secondary | ICD-10-CM

## 2022-10-14 ENCOUNTER — Encounter: Payer: Self-pay | Admitting: Neurology

## 2022-10-14 ENCOUNTER — Encounter: Payer: Self-pay | Admitting: Internal Medicine

## 2022-10-14 DIAGNOSIS — R202 Paresthesia of skin: Secondary | ICD-10-CM

## 2022-10-14 DIAGNOSIS — R269 Unspecified abnormalities of gait and mobility: Secondary | ICD-10-CM

## 2022-10-31 ENCOUNTER — Ambulatory Visit (HOSPITAL_BASED_OUTPATIENT_CLINIC_OR_DEPARTMENT_OTHER): Payer: No Typology Code available for payment source | Admitting: Internal Medicine

## 2022-11-06 NOTE — Progress Notes (Deleted)
[facility] [address] [place] [address] Phone: [phone] Subjective:    I'm seeing this patient by the request  of:  Verlee Monte, NP  CC:   [id number]  Theresa Gonzales is a 57 y.o. female coming in with complaint of gait and balance issues. Last seen in 2022 for shoulder pain. Patient states        Past Medical History:  Diagnosis Date   Anemia    Asthma    Bilateral ovarian cysts    CHF (congestive heart failure) (HCC)    Chronic appendicitis    Chronic migraine without aura    Colon polyps    Diverticulosis    Essential hypertension    Factor V Leiden mutation (HCC)    Gallstones    Hepatic steatosis 12/2019   CT   IBS (irritable bowel syndrome)    Kidney stones    Mixed hyperlipidemia    Obstructive sleep apnea of adult    CPAP   Prinzmetal angina (HCC)    Psoriasis    Skin cancer    Stroke [facility])    Past Surgical History:  Procedure Laterality Date   CHOLECYSTECTOMY     OOPHORECTOMY Left    TONSILLECTOMY     TUBAL LIGATION  1991   Social History   Socioeconomic History   Marital status: Married    Spouse name: Not on file   Number of children: 2   Years of education: Not on file   Highest education level: Not on file  Occupational History   Occupation: Home maker  Tobacco Use   Smoking status: Never   Smokeless tobacco: Never  Vaping Use   Vaping status: Never Used  Substance and Sexual Activity   Alcohol use: Yes    Comment: rarely-once a year   Drug use: Never   Sexual activity: Not on file  Other Topics Concern   Not on file  Social History Narrative   Right handed   Caffeine-occasionally   married   Social Determinants of Health   Financial Resource Strain: Not on file  Food Insecurity: Low Risk  (05/29/2022)   [facility]   Hunger Vital Sign    Worried About Running Out of Food in the Last Year: Never true    Ran Out of Food in the Last Year: Never  true  Transportation Needs: No Transportation Needs (10/27/2022)   [facility]   PRAPARE - Transportation    In the past 12 months, has lack of transportation kept you from medical appointments or from getting medications?: No    Lack of Transportation (Non-Medical): No  Physical Activity: Not on file  Stress: Not on file  Social Connections: Not on file   Allergies  Allergen Reactions   Penicillins Anaphylaxis and Other (See Comments)   Beef Allergy    Codeine Other (See Comments)    Spasms    Gluten Meal Other (See Comments)   Lactose Intolerance (Gi)    Medroxyprogesterone     Other Reaction(s): other   Meperidine Hcl    Statins     Crestor, Lipitor   Sulfa Antibiotics Other (See Comments)    Cramping and bloating    Tilactase     Other Reaction(s): GI Intolerance, Not available DAIRY   Family History  Problem Relation Age of Onset   Hypertension Mother    Alzheimer's disease Mother    Colon polyps Mother  Factor V Leiden deficiency Mother    Deep vein thrombosis Mother    Heart disease Mother    Hypertension Father    Heart disease Father    Multiple sclerosis Sister    Alzheimer's disease Maternal Grandmother    Colon polyps Brother    Diabetes Brother    Liver disease Maternal Grandfather    Liver disease Paternal Grandfather    Liver disease Paternal Uncle    Colon polyps Sister    Factor V Leiden deficiency Sister    Celiac disease Niece    Ulcerative colitis Niece    Irritable bowel syndrome Niece    Colon cancer Neg Hx    Pancreatic cancer Neg Hx    Esophageal cancer Neg Hx     Current Outpatient Medications (Endocrine & Metabolic):    liothyronine (CYTOMEL) 5 MCG tablet, Take 5 mcg by mouth daily.  Current Outpatient Medications (Cardiovascular):    EPINEPHrine 0.3 mg/0.3 mL IJ SOAJ injection, as needed.   Evolocumab (REPATHA SURECLICK) 140 MG/ML SOAJ, Inject 140 mg into the skin every 14 (fourteen) days.    hydrochlorothiazide (HYDRODIURIL) 25 MG tablet, Take 25 mg by mouth daily.   icosapent Ethyl (VASCEPA) 1 g capsule, Take 2 capsules (2 g total) by mouth 2 (two) times daily.   nitroGLYCERIN (NITROSTAT) 0.4 MG SL tablet, Place 0.4 mg under the tongue every 5 (five) minutes as needed for chest pain.  Current Outpatient Medications (Respiratory):    albuterol (VENTOLIN HFA) 108 (90 Base) MCG/ACT inhaler, Inhale 2 puffs into the lungs every 4 (four) hours as needed.  Current Outpatient Medications (Analgesics):    meloxicam (MOBIC) 15 MG tablet, TAKE 1 TABLET(15 MG) BY MOUTH DAILY (Patient not taking: Reported on 09/27/2022)  Current Outpatient Medications (Hematological):    Ferric Maltol (ACCRUFER) 30 MG CAPS, Take 30 mg by mouth daily. (Patient not taking: Reported on 09/04/2022)   Ferrous Gluconate 324 (37.5 Fe) MG TABS, Take 324 mg by mouth daily. (Patient not taking: Reported on 09/04/2022)  Current Outpatient Medications (Other):    COLLAGEN PO, Take 1 tablet by mouth daily. (Patient not taking: Reported on 09/04/2022)   fluocinonide (LIDEX) 0.05 % external solution, Apply 1 Application topically 2 (two) times daily.   Multiple Vitamin (MULTIVITAMIN WITH MINERALS) TABS tablet, Take 1 tablet by mouth daily.   NON FORMULARY, Take 1 capsule by mouth as needed (constipation).   polyethylene glycol (MIRALAX / GLYCOLAX) 17 g packet, Take 17 g by mouth daily.   sucralfate (CARAFATE) 1 g tablet, Take 1 g by mouth 2 (two) times daily as needed.   valACYclovir (VALTREX) 500 MG tablet, Take 500 mg by mouth daily as needed.   Zinc 30 MG CAPS, Take 30 mg by mouth daily. (Patient not taking: Reported on 09/04/2022)   Reviewed prior external information including notes and imaging from  primary care provider As well as notes that were available from care everywhere and other healthcare systems.  Past medical history, social, surgical and family history all reviewed in electronic medical record.  No pertanent  information unless stated regarding to the chief complaint.   Review of Systems:  No headache, visual changes, nausea, vomiting, diarrhea, constipation, dizziness, abdominal pain, skin rash, fevers, chills, night sweats, weight loss, swollen lymph nodes, body aches, joint swelling, chest pain, shortness of breath, mood changes. POSITIVE muscle aches  Objective  There were no vitals taken for this visit.   General: No apparent distress alert and oriented x3 mood and affect normal,  dressed appropriately.  HEENT: Pupils equal, extraocular movements intact  Respiratory: Patient's speak in full sentences and does not appear short of breath  Cardiovascular: No lower extremity edema, non tender, no erythema      Impression and Recommendations:

## 2022-11-07 ENCOUNTER — Ambulatory Visit: Payer: Federal, State, Local not specified - PPO | Admitting: Family Medicine

## 2022-11-15 ENCOUNTER — Ambulatory Visit (HOSPITAL_COMMUNITY): Payer: Federal, State, Local not specified - PPO | Admitting: Speech Pathology

## 2022-11-15 ENCOUNTER — Ambulatory Visit (HOSPITAL_COMMUNITY): Payer: Federal, State, Local not specified - PPO

## 2022-11-24 ENCOUNTER — Ambulatory Visit: Payer: No Typology Code available for payment source | Admitting: Neurology

## 2022-11-29 ENCOUNTER — Encounter: Payer: No Typology Code available for payment source | Admitting: Neurology

## 2022-11-29 ENCOUNTER — Ambulatory Visit (HOSPITAL_COMMUNITY): Payer: Federal, State, Local not specified - PPO | Admitting: Occupational Therapy

## 2022-12-20 ENCOUNTER — Encounter: Payer: Self-pay | Admitting: Internal Medicine

## 2022-12-21 ENCOUNTER — Ambulatory Visit: Payer: Federal, State, Local not specified - PPO | Admitting: Internal Medicine

## 2023-01-05 LAB — NMR, LIPOPROFILE
Cholesterol, Total: 274 mg/dL — ABNORMAL HIGH (ref 100–199)
HDL Particle Number: 39.9 umol/L (ref >=30.5)
HDL-C: 48 mg/dL (ref >39)
LDL Particle Number: 2646 nmol/L — ABNORMAL HIGH (ref <1000)
LDL Size: 20.1 nmol — ABNORMAL LOW (ref >20.5)
LDL-C (NIH Calc): 176 mg/dL — ABNORMAL HIGH (ref 0–99)
LP-IR Score: 79 — ABNORMAL HIGH (ref <=45)
Small LDL Particle Number: 1618 nmol/L — ABNORMAL HIGH (ref <=527)
Triglycerides: 263 mg/dL — ABNORMAL HIGH (ref 0–149)

## 2023-01-10 ENCOUNTER — Ambulatory Visit: Payer: Federal, State, Local not specified - PPO | Attending: Cardiology | Admitting: Internal Medicine

## 2023-01-10 ENCOUNTER — Encounter: Payer: Self-pay | Admitting: Internal Medicine

## 2023-01-10 VITALS — BP 130/80 | HR 90 | Ht 64.0 in | Wt 154.9 lb

## 2023-01-10 DIAGNOSIS — E7841 Elevated Lipoprotein(a): Secondary | ICD-10-CM

## 2023-01-10 DIAGNOSIS — E785 Hyperlipidemia, unspecified: Secondary | ICD-10-CM

## 2023-01-10 DIAGNOSIS — I2510 Atherosclerotic heart disease of native coronary artery without angina pectoris: Secondary | ICD-10-CM

## 2023-01-10 DIAGNOSIS — E781 Pure hyperglyceridemia: Secondary | ICD-10-CM

## 2023-01-10 MED ORDER — ICOSAPENT ETHYL 1 G PO CAPS: 2.0000 g | ORAL_CAPSULE | Freq: Two times a day (BID) | ORAL | 3 refills | Status: DC

## 2023-01-10 NOTE — Progress Notes (Signed)
Virtual Visit via Video Note   Because of Theresa Gonzales's co-morbid illnesses, she is at least at moderate risk for complications without adequate follow up.  This format is felt to be most appropriate for this patient at this time.  All issues noted in this document were discussed and addressed.  A limited physical exam was performed with this format.  Please refer to the patient's chart for her consent to telehealth for [facility].      Date:  01/10/2023   ID:  Theresa Gonzales, DOB Feb 19, 1965, MRN [id number] The patient was identified using 2 identifiers.  Evaluation Performed:  Follow-Up Visit  Patient Location:  [address] [place] [address]  Provider location:   [address] [place], [place] [address]  PCP:  Theresa Ferretti, DO  Cardiologist:  Theresa Nose, MD Electrophysiologist:  None   Chief Complaint:  Follow-up dyslipidemia  History of Present Illness:    Theresa Gonzales is a 57 y.o. female who presents via audio/video conferencing for a telehealth visit today.  This is a pleasant female kindly referred for evaluation management of dyslipidemia.  She reports longstanding history of high cholesterol and unfortunately intolerance to a number of medications including statins in the past.  She was noted recently to have at least moderate to severe aortic stenosis with preserved LV function on echo.  Subsequently she sought out care at [facility] and is scheduled for left and right heart catheterization in 2 days from now.  There is family history of heart disease including her father who had MI at age 83.  Her most recent lipids were poorly controlled with total cholesterol 337, triglycerides 595 (down from 802), HDL 44 and LDL 174.  She also has a reported history of stroke in the past and her target LDL is less than 70.  She has previously tried Lipitor, Crestor, fenofibrate, Lovaza and other therapies over the years.  All of which caused side  effects primarily myalgias.  09/04/2022  Theresa Gonzales returns today for follow-up.  She has been on Repatha.  She seems to be tolerating this well.  She had labs through her PCP back on July 9.  This showed total cholesterol 240, triglycerides 527, HDL 45 and LDL could not be calculated.  We did also order an NMR and LP(a) however this was drawn just past Friday, 8/2 and the labs have not yet been processed.  She tells me she enrolled in an LP(a) study since we did not have a study available.  I would not be blinded to her lipids however she should not share that with the principal investigator.  As her triglycerides remain elevated we discussed today about other possible therapeutic options including Vascepa.  This is based on the 2019 REDUCE-IT trial data showing about a 30% relative risk reduction in cardiovascular disease.  She was found to have coronary artery disease including two-vessel disease that was not amenable to intervention and managed medically.  She was placed on a beta-blocker although reports significant fatigue with this.  She is cut it back but is interested in stopping it.  01/10/2023  Theresa Gonzales returns today for follow-up.  She is off of all of her lipid-lowering meds except for Vascepa.  She has had unfortunately progressive neurologic symptoms which included leg and arm weakness and ultimately was seen by neurology in [place], then in [place] and was referred for clinical trial options at [facility].  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  She tells me that her research indicates that maintaining higher cholesterol levels actually is better for her neurologic disorder but that there is evidence that fish oils and antioxidants may be beneficial.  She has had triglyceride lowering on Vascepa and feels that it is a good option to continue.  She is not interested in any additional lipid-lowering therapies.  She is not on any therapy  to lower LP(a) or involved in any clinical research trials regarding this.  Recent labs, not surprisingly show a high LDL of 176, LDL particle number of 2646, HDL 48 and triglycerides 263.  Small LDL particle [id number].   Prior CV studies:   The following studies were reviewed today:  Chart reviewed, labwork  PMHx:  Past Medical History:  Diagnosis Date   Anemia    Asthma    Bilateral ovarian cysts    CHF (congestive heart failure) (HCC)    Chronic appendicitis    Chronic migraine without aura    Colon polyps    Diverticulosis    Essential hypertension    Factor V Leiden mutation (HCC)    Gallstones    Hepatic steatosis 12/2019   CT   IBS (irritable bowel syndrome)    Kidney stones    Mixed hyperlipidemia    Obstructive sleep apnea of adult    CPAP   Prinzmetal angina (HCC)    Psoriasis    Skin cancer    Stroke [facility])     Past Surgical History:  Procedure Laterality Date   CHOLECYSTECTOMY     OOPHORECTOMY Left    TONSILLECTOMY     TUBAL LIGATION  1991    FAMHx:  Family History  Problem Relation Age of Onset   Hypertension Mother    Alzheimer's disease Mother    Colon polyps Mother    Factor V Leiden deficiency Mother    Deep vein thrombosis Mother    Heart disease Mother    Hypertension Father    Heart disease Father    Multiple sclerosis Sister    Alzheimer's disease Maternal Grandmother    Colon polyps Brother    Diabetes Brother    Liver disease Maternal Grandfather    Liver disease Paternal Grandfather    Liver disease Paternal Uncle    Colon polyps Sister    Factor V Leiden deficiency Sister    Celiac disease Niece    Ulcerative colitis Niece    Irritable bowel syndrome Niece    Colon cancer Neg Hx    Pancreatic cancer Neg Hx    Esophageal cancer Neg Hx     SOCHx:   reports that she has never smoked. She has never used smokeless tobacco. She reports current alcohol use. She reports that she does not use drugs.  ALLERGIES:  Allergies   Allergen Reactions   Penicillins Anaphylaxis and Other (See Comments)   Alpha-Gal    Beef Allergy    Codeine Other (See Comments)    Spasms    Gluten Meal Other (See Comments)   Lactose Intolerance (Gi)    Medroxyprogesterone     Other Reaction(s): other   Meperidine Hcl    Statins     Crestor, Lipitor   Sulfa Antibiotics Other (See Comments)    Cramping and bloating    Tilactase     Other Reaction(s): GI Intolerance, Not available DAIRY    MEDS:  Current Meds  Medication Sig   albuterol (VENTOLIN HFA)  108 (90 Base) MCG/ACT inhaler Inhale 2 puffs into the lungs every 4 (four) hours as needed.   EPINEPHrine 0.3 mg/0.3 mL IJ SOAJ injection as needed.   fluocinonide (LIDEX) 0.05 % external solution Apply 1 Application topically 2 (two) times daily.   hydrochlorothiazide (HYDRODIURIL) 25 MG tablet Take 25 mg by mouth daily.   icosapent Ethyl (VASCEPA) 1 g capsule Take 2 capsules (2 g total) by mouth 2 (two) times daily.   liothyronine (CYTOMEL) 5 MCG tablet Take 5 mcg by mouth daily.   meloxicam (MOBIC) 15 MG tablet TAKE 1 TABLET(15 MG) BY MOUTH DAILY   Multiple Vitamin (MULTIVITAMIN WITH MINERALS) TABS tablet Take 1 tablet by mouth daily.   nitroGLYCERIN (NITROSTAT) 0.4 MG SL tablet Place 0.4 mg under the tongue every 5 (five) minutes as needed for chest pain.   NON FORMULARY Take 1 capsule by mouth as needed (constipation).   polyethylene glycol (MIRALAX / GLYCOLAX) 17 g packet Take 17 g by mouth daily.   sucralfate (CARAFATE) 1 g tablet Take 1 g by mouth 2 (two) times daily as needed.   valACYclovir (VALTREX) 500 MG tablet Take 500 mg by mouth daily as needed.   Zinc 30 MG CAPS Take 30 mg by mouth daily.     ROS: Pertinent items noted in HPI and remainder of comprehensive ROS otherwise negative.  Labs/Other Tests and Data Reviewed:    Recent Labs: 03/13/2022: Creatinine, Ser 0.80 09/28/2022: TSH 2.160   Recent Lipid Panel No results found for: "CHOL", "TRIG", "HDL",  "CHOLHDL", "LDLCALC", "LDLDIRECT"  Wt Readings from Last 3 Encounters:  01/10/23 154 lb 14.4 oz (70.3 kg)  09/27/22 157 lb (71.2 kg)  09/04/22 161 lb (73 kg)     Exam:    Vital Signs:  BP 130/80   Pulse 90   Ht 5\' 4"  (1.626 m)   Wt 154 lb 14.4 oz (70.3 kg)   BMI 26.59 kg/m    General appearance: alert and no distress Lungs: no wheezing Abdomen: normal weight Extremities: extremities normal, atraumatic, no cyanosis or edema Neurologic: Mental status: Alert, oriented, thought content appropriate  ASSESSMENT & PLAN:    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  She is hopefully a candidate for upcoming research trials as she was seen at [facility] and [facility].  For now she is not on any lipid-lowering therapy.  Cholesterol is very high although triglycerides are about 50% to where they have been in the past.  She is currently only doing once daily Vascepa but will increase it to twice daily.  She wants to continue to follow-up with repeat labs and I will plan a follow-up visit in 1 year.  Patient Risk:   After full review of this patients clinical status, I feel that they are at least moderate risk at this time.  Time:   Today, I have spent 15 minutes with the patient with telehealth technology discussing dyslipidemia, ALS, history of stroke/TIA, aortic stenosis.     Medication Adjustments/Labs and Tests Ordered: Current medicines are reviewed at length with the patient today.  Concerns regarding medicines are outlined above.   Tests Ordered: No orders of the defined types were placed in this  encounter.  Medication Changes: No orders of the defined types were placed in this encounter.   Disposition:  in 1 year(s)  Theresa Nose, MD, [facility], FACP  [place]  [facility]  Medical Director of the Advanced Lipid Disorders &  Cardiovascular Risk Reduction Clinic Diplomate of the American Board of Clinical Lipidology Attending Cardiologist  Direct Dial: [phone]  Fax: [phone]  Website:  [email or web address]  Theresa Nose, MD  01/10/2023 8:50 AM

## 2023-01-10 NOTE — Patient Instructions (Signed)
Medication Instructions:  Vascepa refilled NO MED CHANGES  *If you need a refill on your cardiac medications before your next appointment, please call your pharmacy*   Lab Work: FASTING NMR lipoprofile about a week before next visit -- in 1 year  If you have labs (blood work) drawn today and your tests are completely normal, you will receive your results only by: MyChart Message (if you have MyChart) OR A paper copy in the mail If you have any lab test that is abnormal or we need to change your treatment, we will call you to review the results.    Follow-Up: At [facility], you and your health needs are our priority.  As part of our continuing mission to provide you with exceptional heart care, we have created designated Provider Care Teams.  These Care Teams include your primary Cardiologist (physician) and Advanced Practice Providers (APPs -  Physician Assistants and Nurse Practitioners) who all work together to provide you with the care you need, when you need it.  We recommend signing up for the patient portal called "MyChart".  Sign up information is provided on this After Visit Summary.  MyChart is used to connect with patients for Virtual Visits (Telemedicine).  Patients are able to view lab/test results, encounter notes, upcoming appointments, etc.  Non-urgent messages can be sent to your provider as well.   To learn more about what you can do with MyChart, go to [email or web address].    Your next appointment:   12 months with Dr. Rennis Golden -- lipid clinic - video visit

## 2023-01-10 NOTE — Addendum Note (Signed)
Addended by: Lindell Spar on: 01/10/2023 09:10 AM   Modules accepted: Orders

## 2023-01-19 ENCOUNTER — Encounter: Payer: Self-pay | Admitting: Internal Medicine

## 2023-01-22 ENCOUNTER — Encounter: Payer: Self-pay | Admitting: Internal Medicine

## 2023-01-29 ENCOUNTER — Other Ambulatory Visit: Payer: Self-pay | Admitting: *Deleted

## 2023-01-29 DIAGNOSIS — E7841 Elevated Lipoprotein(a): Secondary | ICD-10-CM

## 2023-01-29 DIAGNOSIS — E785 Hyperlipidemia, unspecified: Secondary | ICD-10-CM

## 2023-01-29 DIAGNOSIS — I2510 Atherosclerotic heart disease of native coronary artery without angina pectoris: Secondary | ICD-10-CM

## 2023-01-29 DIAGNOSIS — E781 Pure hyperglyceridemia: Secondary | ICD-10-CM

## 2023-08-31 DEATH — deceased

## 2023-11-21 ENCOUNTER — Encounter: Payer: Self-pay | Admitting: Internal Medicine
